# Patient Record
Sex: Female | Born: 1975 | Race: White | Hispanic: No | Marital: Married | Smoking: Current every day smoker
Health system: Southern US, Community
[De-identification: ages and names within clinical notes are randomized; demographics above are authoritative.]

## PROBLEM LIST (undated history)

## (undated) DIAGNOSIS — R55 Syncope and collapse: Secondary | ICD-10-CM

## (undated) DIAGNOSIS — F319 Bipolar disorder, unspecified: Secondary | ICD-10-CM

## (undated) DIAGNOSIS — I341 Nonrheumatic mitral (valve) prolapse: Secondary | ICD-10-CM

## (undated) DIAGNOSIS — G2581 Restless legs syndrome: Secondary | ICD-10-CM

## (undated) DIAGNOSIS — H469 Unspecified optic neuritis: Secondary | ICD-10-CM

## (undated) DIAGNOSIS — F32A Depression, unspecified: Secondary | ICD-10-CM

## (undated) DIAGNOSIS — F329 Major depressive disorder, single episode, unspecified: Secondary | ICD-10-CM

## (undated) DIAGNOSIS — R51 Headache: Secondary | ICD-10-CM

## (undated) DIAGNOSIS — F419 Anxiety disorder, unspecified: Secondary | ICD-10-CM

## (undated) HISTORY — PX: TUBAL LIGATION: SHX77

## (undated) HISTORY — PX: TONSILLECTOMY: SUR1361

## (undated) HISTORY — PX: ABDOMINAL HYSTERECTOMY: SHX81

## (undated) HISTORY — PX: TYMPANOSTOMY TUBE PLACEMENT: SHX32

---

## 1997-07-05 ENCOUNTER — Other Ambulatory Visit: Admission: RE | Admit: 1997-07-05 | Discharge: 1997-07-05 | Payer: Self-pay | Admitting: Obstetrics and Gynecology

## 1997-08-15 ENCOUNTER — Emergency Department (HOSPITAL_COMMUNITY): Admission: EM | Admit: 1997-08-15 | Discharge: 1997-08-15 | Payer: Self-pay | Admitting: Endocrinology

## 1997-08-20 ENCOUNTER — Emergency Department (HOSPITAL_COMMUNITY): Admission: EM | Admit: 1997-08-20 | Discharge: 1997-08-20 | Payer: Self-pay | Admitting: Internal Medicine

## 1997-10-20 ENCOUNTER — Ambulatory Visit (HOSPITAL_COMMUNITY): Admission: RE | Admit: 1997-10-20 | Discharge: 1997-10-20 | Payer: Self-pay | Admitting: Obstetrics and Gynecology

## 1997-10-25 ENCOUNTER — Inpatient Hospital Stay (HOSPITAL_COMMUNITY): Admission: AD | Admit: 1997-10-25 | Discharge: 1997-10-25 | Payer: Self-pay | Admitting: Obstetrics and Gynecology

## 1998-01-09 ENCOUNTER — Inpatient Hospital Stay (HOSPITAL_COMMUNITY): Admission: AD | Admit: 1998-01-09 | Discharge: 1998-01-11 | Payer: Self-pay | Admitting: Obstetrics and Gynecology

## 1998-06-28 ENCOUNTER — Emergency Department (HOSPITAL_COMMUNITY): Admission: EM | Admit: 1998-06-28 | Discharge: 1998-06-28 | Payer: Self-pay

## 1999-07-19 ENCOUNTER — Ambulatory Visit (HOSPITAL_COMMUNITY): Admission: RE | Admit: 1999-07-19 | Discharge: 1999-07-19 | Payer: Self-pay | Admitting: Obstetrics and Gynecology

## 1999-07-19 ENCOUNTER — Encounter: Payer: Self-pay | Admitting: Obstetrics and Gynecology

## 1999-12-06 ENCOUNTER — Inpatient Hospital Stay (HOSPITAL_COMMUNITY): Admission: AD | Admit: 1999-12-06 | Discharge: 1999-12-08 | Payer: Self-pay | Admitting: Obstetrics and Gynecology

## 2000-11-17 ENCOUNTER — Emergency Department (HOSPITAL_COMMUNITY): Admission: EM | Admit: 2000-11-17 | Discharge: 2000-11-18 | Payer: Self-pay | Admitting: Emergency Medicine

## 2000-11-17 ENCOUNTER — Encounter: Payer: Self-pay | Admitting: Emergency Medicine

## 2000-11-18 ENCOUNTER — Encounter: Payer: Self-pay | Admitting: Emergency Medicine

## 2000-11-22 ENCOUNTER — Encounter: Payer: Self-pay | Admitting: Emergency Medicine

## 2000-11-22 ENCOUNTER — Encounter: Admission: RE | Admit: 2000-11-22 | Discharge: 2000-11-22 | Payer: Self-pay | Admitting: Emergency Medicine

## 2000-12-22 ENCOUNTER — Encounter: Admission: RE | Admit: 2000-12-22 | Discharge: 2000-12-22 | Payer: Self-pay | Admitting: Emergency Medicine

## 2000-12-22 ENCOUNTER — Encounter: Payer: Self-pay | Admitting: Emergency Medicine

## 2001-08-25 ENCOUNTER — Ambulatory Visit (HOSPITAL_COMMUNITY): Admission: RE | Admit: 2001-08-25 | Discharge: 2001-08-25 | Payer: Self-pay | Admitting: Obstetrics and Gynecology

## 2001-08-25 ENCOUNTER — Encounter: Payer: Self-pay | Admitting: Obstetrics and Gynecology

## 2001-09-08 ENCOUNTER — Inpatient Hospital Stay (HOSPITAL_COMMUNITY): Admission: AD | Admit: 2001-09-08 | Discharge: 2001-09-08 | Payer: Self-pay | Admitting: Obstetrics and Gynecology

## 2002-01-24 ENCOUNTER — Inpatient Hospital Stay (HOSPITAL_COMMUNITY): Admission: AD | Admit: 2002-01-24 | Discharge: 2002-01-26 | Payer: Self-pay | Admitting: Obstetrics and Gynecology

## 2002-01-25 ENCOUNTER — Encounter (INDEPENDENT_AMBULATORY_CARE_PROVIDER_SITE_OTHER): Payer: Self-pay

## 2002-10-14 ENCOUNTER — Emergency Department (HOSPITAL_COMMUNITY): Admission: EM | Admit: 2002-10-14 | Discharge: 2002-10-14 | Payer: Self-pay | Admitting: Emergency Medicine

## 2003-01-27 ENCOUNTER — Inpatient Hospital Stay (HOSPITAL_COMMUNITY): Admission: EM | Admit: 2003-01-27 | Discharge: 2003-02-02 | Payer: Self-pay | Admitting: Psychiatry

## 2003-06-25 ENCOUNTER — Emergency Department (HOSPITAL_COMMUNITY): Admission: EM | Admit: 2003-06-25 | Discharge: 2003-06-25 | Payer: Self-pay | Admitting: Emergency Medicine

## 2003-06-29 ENCOUNTER — Emergency Department (HOSPITAL_COMMUNITY): Admission: EM | Admit: 2003-06-29 | Discharge: 2003-06-29 | Payer: Self-pay | Admitting: Emergency Medicine

## 2003-07-08 ENCOUNTER — Encounter: Admission: RE | Admit: 2003-07-08 | Discharge: 2003-07-08 | Payer: Self-pay | Admitting: Emergency Medicine

## 2003-07-16 ENCOUNTER — Emergency Department (HOSPITAL_COMMUNITY): Admission: EM | Admit: 2003-07-16 | Discharge: 2003-07-17 | Payer: Self-pay | Admitting: Emergency Medicine

## 2003-07-17 ENCOUNTER — Inpatient Hospital Stay (HOSPITAL_COMMUNITY): Admission: EM | Admit: 2003-07-17 | Discharge: 2003-07-19 | Payer: Self-pay | Admitting: Psychiatry

## 2003-08-12 ENCOUNTER — Ambulatory Visit (HOSPITAL_COMMUNITY): Admission: RE | Admit: 2003-08-12 | Discharge: 2003-08-12 | Payer: Self-pay | Admitting: Internal Medicine

## 2003-09-14 ENCOUNTER — Inpatient Hospital Stay (HOSPITAL_COMMUNITY): Admission: EM | Admit: 2003-09-14 | Discharge: 2003-09-16 | Payer: Self-pay | Admitting: Emergency Medicine

## 2006-05-14 ENCOUNTER — Other Ambulatory Visit: Admission: RE | Admit: 2006-05-14 | Discharge: 2006-05-14 | Payer: Self-pay | Admitting: Family Medicine

## 2007-10-09 ENCOUNTER — Other Ambulatory Visit: Admission: RE | Admit: 2007-10-09 | Discharge: 2007-10-09 | Payer: Self-pay | Admitting: Family Medicine

## 2008-04-23 ENCOUNTER — Ambulatory Visit (HOSPITAL_COMMUNITY): Admission: RE | Admit: 2008-04-23 | Discharge: 2008-04-23 | Payer: Self-pay | Admitting: Gastroenterology

## 2008-10-25 ENCOUNTER — Other Ambulatory Visit: Admission: RE | Admit: 2008-10-25 | Discharge: 2008-10-25 | Payer: Self-pay | Admitting: Family Medicine

## 2009-01-10 ENCOUNTER — Emergency Department (HOSPITAL_COMMUNITY): Admission: EM | Admit: 2009-01-10 | Discharge: 2009-01-10 | Payer: Self-pay | Admitting: Emergency Medicine

## 2009-04-13 ENCOUNTER — Emergency Department (HOSPITAL_COMMUNITY): Admission: EM | Admit: 2009-04-13 | Discharge: 2009-04-13 | Payer: Self-pay | Admitting: Emergency Medicine

## 2010-02-16 ENCOUNTER — Emergency Department (HOSPITAL_BASED_OUTPATIENT_CLINIC_OR_DEPARTMENT_OTHER)
Admission: EM | Admit: 2010-02-16 | Discharge: 2010-02-16 | Payer: Self-pay | Source: Home / Self Care | Admitting: Emergency Medicine

## 2010-05-02 LAB — POCT I-STAT, CHEM 8
BUN: 6 mg/dL (ref 6–23)
HCT: 40 % (ref 36.0–46.0)
Hemoglobin: 13.6 g/dL (ref 12.0–15.0)
TCO2: 26 mmol/L (ref 0–100)

## 2010-05-02 LAB — DIFFERENTIAL
Basophils Absolute: 0.1 10*3/uL (ref 0.0–0.1)
Basophils Relative: 1 % (ref 0–1)
Eosinophils Relative: 3 % (ref 0–5)
Lymphocytes Relative: 23 % (ref 12–46)
Lymphs Abs: 2.1 10*3/uL (ref 0.7–4.0)
Monocytes Absolute: 0.5 10*3/uL (ref 0.1–1.0)
Monocytes Relative: 6 % (ref 3–12)
Neutro Abs: 6.2 10*3/uL (ref 1.7–7.7)
Neutrophils Relative %: 68 % (ref 43–77)

## 2010-05-02 LAB — POCT CARDIAC MARKERS
Myoglobin, poc: 90.7 ng/mL (ref 12–200)
Troponin i, poc: <0.05 ng/mL (ref 0.00–0.09)
Troponin i, poc: <0.05 ng/mL (ref 0.00–0.09)

## 2010-05-02 LAB — CBC
Hemoglobin: 13.6 g/dL (ref 12.0–15.0)
Platelets: 225 10*3/uL (ref 150–400)
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
## 2010-06-16 NOTE — Discharge Summary (Signed)
NAME:  Lynn Hubbard, Lynn Hubbard                             ACCOUNT NO.:  [id number]   MEDICAL RECORD NO.:  [id number]                   PATIENT TYPE:  IPS   LOCATION:  0502                                 FACILITY:  [facility]   PHYSICIAN:  Carolanne Grumbling, M.D.                 DATE OF BIRTH:  [birth year]   DATE OF ADMISSION:  01/27/2003  DATE OF DISCHARGE:  02/02/2003                                 DISCHARGE SUMMARY   INITIAL ASSESSMENT AND DIAGNOSIS:  The patient is a 35 year old woman who  presented with depression.  She had some suicidal ideation with a plan to  overdose.  She also had some positive homicidal ideation towards the woman  she had an affair with.  She had revealed the affair to her husband.  They  had been confronted by the pastor at the church because they are all church  members of the same church.  Her husband was upset and telling her to break  it off with this woman or he would take the children.  She also had some  decreased sleep, trouble with her appetite, feelings of hopelessness and  helplessness.  She had no outpatient history for psychiatric problems in the  past.   MENTAL STATUS EXAM:  At the time of the initial evaluation revealed an  alert, oriented young woman, who came to the interview willingly and was  cooperative.  She was appropriately dressed and groomed.  Speech was clear.  She felt very guilty. She was depressed.  Affect was sad and tearful.  She  was positive for suicidal ideation and some homicidal thoughts towards the  woman she had the affair with.  There was no evidence of any thought  disorder and other psychosis.  Insight and judgment were currently adequate.   PHYSICAL EXAMINATION:  Noncontributory.   ADMISSION DIAGNOSES:   AXIS I:  Depressive disorder not otherwise specified.   AXIS II:  Deferred.   AXIS III:  Healthy.   AXIS IV:  Severe.   AXIS V:  30/70.   FINDINGS:  All indicated laboratory examinations were within normal limits  or  noncontributory.   HOSPITAL COURSE:  While in the hospital, the patient talked about all of her  issues.  She was definitely questioning her sexual identity at the moment.  She felt closer to the woman in the relationship than she does to her  husband but, at the same time, she loves her children and likes the  marriage.  She cannot really blame the husband for anything substantial and  she does not want to lose the children and really did not want to lose the  marriage at this point, though the children were the primary objective.  Consequently, she was willing to try to work it out with the husband.  The  other woman, she said, betrayed her by lying about how things really were  and, therefore, she looked worse in the church and in other people's eyes  than she thought she deserved and that is why she had the homicidal ideation  towards her.  These seemed to be more feelings and not something she would  act upon.  Her suicidal ideation disappeared after her admission and did not  return.  She had a session with her husband and they both decided to work  things out through marital counseling. At the time of discharge, she was  still feeling depressed but no longer suicidal and was more optimistic that  things could work out in the future.  She was making no threats towards the  former girlfriend or anyone else.  She wanted to go home to be with her  children.   FINAL DIAGNOSES:   AXIS I:  Depressive disorder not otherwise specified.   AXIS II:  Deferred.   AXIS III:  Healthy.   AXIS IV:  Severe.   AXIS V:  55/70.   DISCHARGE MEDICATIONS:  1. Celexa 60 mg daily.  2. Ambien 10 mg at bedtime as needed.  3. Lamictal 25 mg daily.  4. Clonazepam 0.25 mg twice daily and 1 at bedtime as needed.   ACTIVITY/DIET:  There were no restrictions.   FOLLOW UP:  She was to follow up at [facility] with an  appointment with Dr. Jennelle Human for February 16, 2003.                                                Carolanne Grumbling, M.D.    GT/MEDQ  D:  02/15/2003  T:  02/15/2003  Job:  [id number]

## 2010-06-16 NOTE — Procedures (Signed)
This is a 35 year old with syncope, rule out seizure. The patient is  experiencing 3 events per week for 3months of loss of consciousness. The  patient is described as being awake throughout the course of the study. It  is routine 17-channel EEG with 1 channel devoted to EKG utilizing the  International 10/20 lead placement system. Electrographically, the patient  appears to be in the waking, drowsy, and light natural sleep state  throughout the course of the study. While awake, the background consists of  a well-organized, well-developed, well-modulated, 10 hertz alpha activity  which is predominant to the posterior head regions and reactive to eye  opening. No interhemispheric asymmetry is identified, and no definite  epileptiform discharges are seen. There is attention of background with  decreased frequency and amplitude during drowsiness and the appearance of  _______________ complexes and sleep spindles during sleep which is fairly  brief. Photic stimulation produced some occipital photic driving at several  flash frequencies close to the patient's native frequency. Hyperventilation  did not produce significant change in the background. EKG monitor reveals  relatively regular rhythm although somewhat tachycardic at 102 beats per  minute.   CONCLUSION:  Normal awake, drowsy, and asleep EEG without seizure activity  or focal abnormality seen during the course of today's recording. Clinical  correlation is recommended.    Tyler Deis, M.D.   NWG:NFAO  D:  09/15/2003 18:05:58  T:  09/16/2003 17:12:40  Job #:  [id number]
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
## 2010-06-16 NOTE — Op Note (Signed)
   NAME:  Lynn Hubbard, Lynn Hubbard                             ACCOUNT NO.:  [id number]   MEDICAL RECORD NO.:  [id number]                   PATIENT TYPE:  INP   LOCATION:  9103                                 FACILITY:  [facility]   PHYSICIAN:  Zenaida Niece, M.D.             DATE OF BIRTH:  11-06-1975   DATE OF PROCEDURE:  01/25/2002  DATE OF DISCHARGE:                                 OPERATIVE REPORT   PREOPERATIVE AND POSTOPERATIVE DIAGNOSES:  1. Desires surgical sterility.  2. Multiparity.   PROCEDURE:  Bilateral partial salpingo-oophorectomy.   SURGEON:  Lavina Hamman, M.D.   ANESTHESIA:  Epidural.   ESTIMATED BLOOD LOSS:  Less than 50 cc.   FINDINGS:  Normal anatomy.   PROCEDURE IN DETAIL:  The patient was taken to the operating room and placed  in the dorsal supine position.  Her previously placed epidural was dosed  appropriately.  Abdomen was prepped and draped in the usual sterile fashion,  and the level of her anesthesia was found to be adequate.  Her  infraumbilical skin was infiltrated with 0.5% Marcaine w/epinephrine, and a  3-cm horizontal incision was made.  The fascia was identified and entered  sharply and thus also entered the peritoneal cavity.  Both fallopian tubes  was identified and traced to their fimbriated ends.  A knuckle tube on each  side was ligated with 0 plain gut suture.  The knuckle tube was removed  sharply on both sides.  Both ossea were identified and the stomas were  hemostatic.  The fascia was closed with 0 Vicryl and skin was closed with  running subcuticular 4-0 Vicryl and a Band-Aid.  The patient tolerated the  procedure well and was taken to the recovery room in stable condition.  Counts were correct.                                               Zenaida Niece, M.D.    TDM/MEDQ  D:  01/25/2002  T:  01/25/2002  Job:  [id number]

## 2010-06-16 NOTE — Discharge Summary (Signed)
[facility]  Patient:    Lynn Hubbard, Lynn Hubbard                          MRN: [id number] Adm. Date:  [id number] Disc. Date: 12/08/99 Attending:  Michaele Offer                           Discharge Summary  DISCHARGE DIAGNOSES:          1. Term pregnancy at 38+ weeks, delivered.                               2. Status post normal spontaneous vaginal                                  delivery.  DISCHARGE MEDICATIONS:        1. Motrin 600 mg p.o. q.6h.                               2. Percocet one to two tablets p.o. q.4h. p.r.n.  DISCHARGE FOLLOW-UP:          Patient is to follow up in the office in six weeks for her routine postpartum examination.  HOSPITAL COURSE:              Patient is a 35 year old G2, P1-0-0-1 who is admitted at 38 weeks for induction given a favorable cervix and severe pelvic pressure.  She had good fetal movement, occasional contractions on admission, and complained of increasing mood swings with a history of postpartum depression.  PRENATAL LABORATORIES:        A+.  RPR nonreactive.  Rubella immune. Hepatitis B surface antigen negative.  HIV negative.  GC negative.  Chlamydia negative.  GBS negative.  PAST OBSTETRICAL HISTORY:     In 1999 she had a normal spontaneous vaginal delivery of a 6 pound 15 ounce infant.  PAST MEDICAL HISTORY:         History of postpartum depression as stated.  PAST SURGICAL HISTORY:        She had a tonsillectomy and myringotomy with tubes.  ALLERGIES:                    LATEX.  SOCIAL HISTORY:               Patient is married and has no history of tobacco or alcohol use.  PHYSICAL EXAMINATION:  VITAL SIGNS:                  She is afebrile with stable vital signs.  Fetal heart rate was reactive.  PELVIC:                       Cervix was 3 cm, 50% effaced, and -1 station, vertex.                                She had an estimated fetal weight of approximately 7 pounds.  She had assist  of rupture of membranes with clear fluid obtained.  Patient was begun on Pitocin augmentation and progressed to 5 cm, 70% effaced, and -1 station receiving her  epidural.  She then reached complete dilation and pushed well with a normal spontaneous vaginal delivery of a vigorous female infant.  Apgars were 9 and 9.  Weight 7 pounds 3 ounces. She had a second degree perineal laceration which was repaired with 3-0 Vicryl.  Cervix and rectum were intact.  EBL was less than 500 cc.  She did well postpartum and on postpartum day #2 had no complaints.  Pain was well controlled and lochia was normal.  She was afebrile with stable vital signs. Her fundus was firm.  Therefore, she was discharged to home to follow up in six weeks with our office. DD:  12/08/99 TD:  12/08/99 Job: [id number]

## 2010-06-16 NOTE — H&P (Signed)
NAME:  ARYAM, ZHAN                             ACCOUNT NO.:  [id number]   MEDICAL RECORD NO.:  [id number]                   PATIENT TYPE:  IPS   LOCATION:  0508                                 FACILITY:  [facility]   PHYSICIAN:  Geoffery Lyons, M.D.                   DATE OF BIRTH:  04-11-1975   DATE OF ADMISSION:  07/17/2003  DATE OF DISCHARGE:                         PSYCHIATRIC ADMISSION ASSESSMENT   IDENTIFYING INFORMATION:  A 35 year old married white female, voluntarily  admitted on July 16, 2003.   HISTORY OF PRESENT ILLNESS:  The patient overdosed on 8 Klonopin at home on  July 16, 2003.  The patient reports that she had had a conflict with her  friend prior to that.  She reports that they were her own medications.  She  states some of the conflict was over an extramarital affair that she had had  with a woman at work.  The patient states that she still wants to be friends  with her.  Her friend thinks that that is not a good idea.  The patient  states that it was a stupid thing to do, overdosing.  She denies that it  was a suicide attempt.  She is not sure why she did it.  Has been sleeping  well.  Her appetite has been satisfactory.  Denies any psychotic symptoms  and states that she cannot let go of this relationship.  Has no prior  suicide attempt.   PAST PSYCHIATRIC HISTORY:  The patient was here in January 2005 for similar  situation.  She sees Dr. Jennelle Human as an outpatient and has a therapist named  Merlyn Albert May.   SOCIAL HISTORY:  She is a 35 year old married white female, married for 6  years, has 3 children, lives with her husband and children.  She works at  [employer] and her children's ages are 5, 3, and 39 months of age.   FAMILY HISTORY:  None.   ALCOHOL DRUG HISTORY:  Denies any alcohol or drug use.  Is a nonsmoker.   PAST MEDICAL HISTORY:  Primary care Yaneli Keithley is Dr. Daleen Squibb in [place].  Medical problems are arthritis in her back, episodes of having low  blood  pressures with falls.  She states it started about 1-1/2 months ago.  She  states it is called neurocardiac syncope and has tilt test scheduled on July 27, 2003.   MEDICATIONS:  Toprol XL 25 mg, Klonopin 0.5 mg b.i.d., Lamictal 150 mg  daily, has been on it for 1-1/2 months, Celexa 60 mg daily.   DRUG ALLERGIES:  No known allergies.   PHYSICAL EXAMINATION:  The patient was assessed at [facility].  She is a well-nourished young female.  Temperature is 99.4, 63  heart rate, 16 respirations, blood pressure is 110/71.  Five feet 6 inches  tall, 136 pounds.  Her urine pregnancy test was  negative.  Urinalysis was  negative.  Alcohol level was less than 5.  Urine drug screen was negative.  BMET was within normal limits.   MENTAL STATUS EXAM:  She is an alert young female, cooperative, good eye  contact, casually dressed.  Speech clear, mood:  The patient feels very  embarrassed.  The patient is pleasant and agreeable to treatment plan.  Thought processes are logical.  The patient reports obsessing over this  extramarital affair.  Cognitive function intact.  Memory is good, judgment  and insight fair.   ADMISSION DIAGNOSES:   AXIS I:  Major depressive disorder, recurrent.   AXIS II:  Deferred.   AXIS III:  Neurocardiac syncope, arthritis.   AXIS IV:  Other psychosocial problems, current medical problems.   AXIS V:  Current is 25, estimated this past year is 65-70.   PLAN:  Voluntary admission for intentional overdose.  Contract for safety,  stabilize mood and thinking.  We will resume her medications.  We will  increase her Lamictal, consider a family session.  The patient is to  increase coping skills by attending groups.   TENTATIVE LENGTH OF CARE:  3-4 days.     Landry Corporal, N.P.                       Geoffery Lyons, M.D.    JO/MEDQ  D:  07/19/2003  T:  07/19/2003  Job:  [id number]

## 2010-06-16 NOTE — Op Note (Signed)
NAME:  Lynn Hubbard, Lynn Hubbard                             ACCOUNT NO.:  [id number]   MEDICAL RECORD NO.:  [id number]                   PATIENT TYPE:  OIB   LOCATION:  2899                                 FACILITY:  [facility]   PHYSICIAN:  Doylene Canning. Ladona Ridgel, M.D.               DATE OF BIRTH:  Apr 06, 1975   DATE OF PROCEDURE:  08/12/2003  DATE OF DISCHARGE:                                 OPERATIVE REPORT   PROCEDURE PERFORMED:  Head up tilt table testing.   INDICATIONS FOR PROCEDURE:  Unexplained syncope.   I:  INTRODUCTION:  The patient is a 35 year old woman with a history of  depression who has otherwise been healthy who has had recurrent syncope over  the last month.  She is now referred for head up tilt table testing.   II:  PROCEDURE:  After informed consent was obtained, the patient was taken  to the diagnostic catheterization lab in a fasting state.  After the usual  preparation and draping, she was placed in the supine position.  Her initial  heart rate was in the mid to high 60s with a blood pressure in the 105 to  110 range.  She was placed in the 70 degree head up tilt table position and  her heart rate increased from the mid 60s to the mid 80s initially.  In  addition, the blood pressure had no significant change initially.  At  approximately 20 minutes into tilting, the patient began to feel weak and  hot and somewhat nauseated.  At 30 minutes into tilting, her blood pressure  dropped into the 80s and her heart rate increased to almost 129 beats per  minute.  At this point, she began to have blurred vision and stated that she  did not feel well.  She was maintained in this position until approximately  44 minutes into tilting when she suddenly became unresponsive.  Interestingly, her heart rate and blood pressure, when they were recorded,  did not show any abnormalities.  She was placed back in the supine position  where she immediately regained responsiveness and was subsequently  returned  to her room in satisfactory condition.   III:  COMPLICATIONS:  There were no immediate procedure complications.   IV:  RESULTS:  This head up tilt table test demonstrates a reproduction in the patient's  clinical symptoms of syncope, however, there was no clear drop in heart rate  or blood pressure at the time she became unresponsive.  In fact, her heart  rate was somewhat increased.  These findings are somewhat puzzling.  They  could certainly suggest psychogenic syncope on the tilt table.  Another  possibility would be that the patient has relative CNS hypoperfusion  resulting in her altered consciousness.  I have discussed the treatment  options with the patient and I have recommended for now a period of watchful  waiting and warning that  should she feel any of these episodes that she  should lie or sit down.  I have also  recommended that she increase her fluid and salt intake.  I have recommended  that she discontinue her beta blocker.  Additional therapies might be to  discontinue her Klonopin and/or Lamictal.  For now, would recommend holding  off on Florinef.                                               Doylene Canning. Ladona Ridgel, M.D.    GWT/MEDQ  D:  08/12/2003  T:  08/12/2003  Job:  [id number]   cc:   Jesse Sans. Wall, M.D.   Reuben Likes, M.D.  [address]  [place]  [place] [address]  Fax: [phone]

## 2010-06-16 NOTE — Discharge Summary (Signed)
NAME:  Lynn Hubbard, Lynn Hubbard                             ACCOUNT NO.:  [id number]   MEDICAL RECORD NO.:  [id number]                   PATIENT TYPE:  IPS   LOCATION:  0508                                 [facility]ILITY[facility]   PHYSICIAN:  Geoffery Lyons, M.D.                   DATE OF BIRTH:  [birth year]   DATE OF ADMISSION:  07/17/2003  DATE OF DISCHARGE:  07/19/2003                                 DISCHARGE SUMMARY   CHIEF COMPLAINT AND PRESENT ILLNESS:  This was the second admission to [facility] for this 35 year old married white female  voluntarily admitted.  She overdosed on eight Klonopin at home on June 17.  She had conflict with her friend prior to that.  They were her own  medications.  Some of the conflict was over and extramarital affair that she  had with a woman at work.  She still wanted to be friends with her.  She  stated that this was a stupid thing to do, overdosing.  She denied it was  a suicide attempt.  She was not sure why she did it.  Not sleeping well.  Satisfactory appetite.  Cannot let go of the relationship.   PAST PSYCHIATRIC HISTORY:  In January 2005, she was admitted for similar  situation.  She was seeing Meredith Staggers, M.D., and Sherron Monday.   SUBSTANCE ABUSE HISTORY:  She denied the use or abuse of any substances.   PAST MEDICAL HISTORY:  1. Arthritis in her back.  2. Low blood sugar with falls.  3. Neurocardiac syncope.   MEDICATIONS:  1. Toprol XL 25 mg per day.  2. Klonopin 0.5 mg twice a day.  3. Lamictal 150 mg daily.  4. Celexa 60 mg daily.   PHYSICAL EXAMINATION:  Physical examination was performed, failed to show  any acute findings.   LABORATORY DATA:  CBC was within normal limits.  Blood chemistries were  within normal limits.  TSH was within normal limits.   MENTAL STATUS EXAM:  Mental status exam revealed an alert, young female,  cooperative, good eye contact, casually dressed.  Speech was clear.  Mood:  Felt embarrassed,  anxious, wanting to be discharged but she was pleasant and  agreeable.  Thought processes were logical, coherent, and relevant; reported  obsessing over this extramarital affair.  Cognitive: Cognition was well  preserved.   ADMISSION DIAGNOSES:   AXIS I:  Major depression, recurrent.   AXIS II:  No diagnosis.   AXIS III:  1. Neurocardiac syncope.  2. Arthritis.   AXIS IV:  Moderate.   AXIS V:  Global assessment of functioning upon admission 30-35, highest  global assessment of functioning in the last year 65-70.   HOSPITAL COURSE:  She was admitted and started in intensive individual and  group psychotherapy.  She was given Ambien for sleep.  She was given  Lamictal 150 mg per day, Celexa 60 mg per day, Toprol XL 25 mg per daily,  Klonopin 0.5 mg three times a day, which was held.  Lamictal was increased  to 200 mg.  She kept endorsing she wanted to hurt herself, very difficult  time, and took the Klonopin.  On June 18, she was feeling much better.  She  was bright, endorsed that she was dealing with the situation better.  There  was a session with her husband and her mother.  There was much concern about  discharge but she reiterated that she was not planning to kill herself,  claimed that she understood that taking eight Klonopin was not a suicide  attempt, endorsed she was feeling much better.  On June 20, she was in full  contact with reality, no suicidal ideas, no homicidal ideas, no  hallucinations, no delusions.  They were to call the woman she was in a  relationship with and express her concerns, felt that she gave some  resolution to this.  She was denying any suicidal ideation, willing and  motivated to pursue further outpatient treatment, so we went ahead and  discharged to outpatient followup.   DISCHARGE DIAGNOSES:   AXIS I:  Major depression, recurrent.   AXIS II:  No diagnosis.   AXIS III:  1. Neurocardiac syncope.  2. Arthritis.   AXIS IV:   Moderate.   AXIS V:  Global assessment of functioning upon discharge 55-60.   DISCHARGE MEDICATIONS:  1. Toprol XL 25 mg per day.  2. Celexa 40 mg one and a half daily.   FOLLOW UP:  She was to follow up with Merlyn Albert May and Meredith Staggers, M.D.                                               Geoffery Lyons, M.D.    IL/MEDQ  D:  08/10/2003  T:  08/12/2003  Job:  [id number]

## 2010-10-26 ENCOUNTER — Emergency Department (HOSPITAL_COMMUNITY)
Admission: EM | Admit: 2010-10-26 | Discharge: 2010-10-26 | Disposition: A | Discharge status: Home / Self Care | Payer: BC Managed Care – PPO | Attending: Emergency Medicine | Admitting: Emergency Medicine

## 2010-10-26 DIAGNOSIS — X58XXXA Exposure to other specified factors, initial encounter: Secondary | ICD-10-CM | POA: Insufficient documentation

## 2010-10-26 DIAGNOSIS — R21 Rash and other nonspecific skin eruption: Secondary | ICD-10-CM | POA: Insufficient documentation

## 2010-10-26 DIAGNOSIS — T781XXA Other adverse food reactions, not elsewhere classified, initial encounter: Secondary | ICD-10-CM | POA: Insufficient documentation

## 2011-06-26 ENCOUNTER — Other Ambulatory Visit: Payer: Self-pay

## 2012-02-17 ENCOUNTER — Emergency Department (HOSPITAL_COMMUNITY): Payer: BC Managed Care – PPO

## 2012-02-17 ENCOUNTER — Encounter (HOSPITAL_COMMUNITY): Payer: Self-pay

## 2012-02-17 ENCOUNTER — Emergency Department (HOSPITAL_COMMUNITY)
Admission: EM | Admit: 2012-02-17 | Discharge: 2012-02-17 | Disposition: A | Discharge status: Home / Self Care | Payer: BC Managed Care – PPO | Attending: Emergency Medicine | Admitting: Emergency Medicine

## 2012-02-17 DIAGNOSIS — N949 Unspecified condition associated with female genital organs and menstrual cycle: Secondary | ICD-10-CM | POA: Insufficient documentation

## 2012-02-17 DIAGNOSIS — R102 Pelvic and perineal pain: Secondary | ICD-10-CM

## 2012-02-17 DIAGNOSIS — F17200 Nicotine dependence, unspecified, uncomplicated: Secondary | ICD-10-CM | POA: Insufficient documentation

## 2012-02-17 DIAGNOSIS — Z79899 Other long term (current) drug therapy: Secondary | ICD-10-CM | POA: Insufficient documentation

## 2012-02-17 DIAGNOSIS — N898 Other specified noninflammatory disorders of vagina: Secondary | ICD-10-CM | POA: Insufficient documentation

## 2012-02-17 DIAGNOSIS — Z3202 Encounter for pregnancy test, result negative: Secondary | ICD-10-CM | POA: Insufficient documentation

## 2012-02-17 DIAGNOSIS — Z8669 Personal history of other diseases of the nervous system and sense organs: Secondary | ICD-10-CM | POA: Insufficient documentation

## 2012-02-17 DIAGNOSIS — N939 Abnormal uterine and vaginal bleeding, unspecified: Secondary | ICD-10-CM

## 2012-02-17 HISTORY — DX: Syncope and collapse: R55

## 2012-02-17 HISTORY — DX: Unspecified optic neuritis: H46.9

## 2012-02-17 HISTORY — DX: Bipolar disorder, unspecified: F31.9

## 2012-02-17 LAB — URINALYSIS, ROUTINE W REFLEX MICROSCOPIC
Bilirubin Urine: NEGATIVE
Ketones, ur: NEGATIVE mg/dL
Leukocytes, UA: NEGATIVE
Urobilinogen, UA: 0.2 mg/dL (ref 0.0–1.0)
pH: 6 (ref 5.0–8.0)

## 2012-02-17 LAB — WET PREP, GENITAL
Trich, Wet Prep: NONE SEEN
Yeast Wet Prep HPF POC: NONE SEEN

## 2012-02-17 LAB — URINE MICROSCOPIC-ADD ON

## 2012-02-17 MED ORDER — HYDROMORPHONE HCL PF 1 MG/ML IJ SOLN
1.0000 mg | Freq: Once | INTRAMUSCULAR | Status: AC
  Administered 2012-02-17: 1 mg via INTRAMUSCULAR
  Filled 2012-02-17: qty 1

## 2012-02-17 MED ORDER — OXYCODONE-ACETAMINOPHEN 5-325 MG PO TABS
2.0000 | ORAL_TABLET | Freq: Once | ORAL | Status: AC
  Administered 2012-02-17: 2 via ORAL
  Filled 2012-02-17: qty 2

## 2012-02-17 MED ORDER — OXYCODONE-ACETAMINOPHEN 5-325 MG PO TABS: 1.0000 | ORAL_TABLET | Freq: Once | ORAL | Status: DC

## 2012-02-17 NOTE — ED Provider Notes (Signed)
History     CSN: [id number]  Arrival date & time 02/17/12  0907   First MD Initiated Contact with Patient 02/17/12 [phone]      Chief Complaint  Patient presents with  . Abdominal Pain    (Consider location/radiation/quality/duration/timing/severity/associated sxs/prior treatment) HPI Comments: Lynn Hubbard is a 37 y.o. Female with pelvic pain, and persistent vaginal bleeding for 2 weeks. This is similar to problems she had several months ago, when she had a pelvic infection. She had an IUD placed 4 months ago. She denies fever, chills, nausea, or vomiting. No dysuria, urinary frequency, or change in bowel habits. She's using over-the-counter medications, without relief. There are no other modifying factors.  Patient is a 37 y.o. female presenting with abdominal pain. The history is provided by the patient.  Abdominal Pain The primary symptoms of the illness include abdominal pain.    Past Medical History  Diagnosis Date  . Optic neuritis   . Syncope   . Bipolar 1 disorder     Past Surgical History  Procedure Date  . Tubal ligation   . Tonsillectomy   . Tympanostomy tube placement     History reviewed. No pertinent family history.  History  Substance Use Topics  . Smoking status: Current Every Day Smoker -- 0.5 packs/day    Types: Cigarettes  . Smokeless tobacco: Never Used  . Alcohol Use: No    OB History    Grav Para Term Preterm Abortions TAB SAB Ect Mult Living                  Review of Systems  Gastrointestinal: Positive for abdominal pain.  All other systems reviewed and are negative.    Allergies  Klonopin  Home Medications   Current Outpatient Rx  Name  Route  Sig  Dispense  Refill  . LITHIUM CARBONATE ER 450 MG PO TBCR   Oral   Take 450 mg by mouth 2 (two) times daily.         Marland Kitchen LURASIDONE HCL 40 MG PO TABS   Oral   Take 20 mg by mouth at bedtime.         . ADULT MULTIVITAMIN W/MINERALS CH   Oral   Take 1 tablet by mouth daily.           Marland Kitchen PRAMIPEXOLE DIHYDROCHLORIDE 1.5 MG PO TABS   Oral   Take 1.5 mg by mouth at bedtime.         . VENLAFAXINE HCL ER 150 MG PO CP24   Oral   Take 150 mg by mouth daily.         . OXYCODONE-ACETAMINOPHEN 5-325 MG PO TABS   Oral   Take 1 tablet by mouth once.   30 tablet   0     BP 102/65  Pulse 62  Temp 98.6 F (37 C)  Resp 20  SpO2 95%  LMP 01/28/2012  Physical Exam  Nursing note and vitals reviewed. Constitutional: She is oriented to person, place, and time. She appears well-developed and well-nourished.  HENT:  Head: Normocephalic and atraumatic.  Eyes: Conjunctivae normal and EOM are normal. Pupils are equal, round, and reactive to light.  Neck: Normal range of motion and phonation normal. Neck supple.  Cardiovascular: Normal rate, regular rhythm and intact distal pulses.   Pulmonary/Chest: Effort normal and breath sounds normal. She exhibits no tenderness.  Abdominal: Soft. She exhibits no distension. There is tenderness (Moderate superpubic tenderness). There is no guarding.  Genitourinary:  Normal external female genitalia. There is a opaque vaginal discharge. There is mild. Bleeding from the cervical os. The IUD string is visible in the cervical os. On bimanual examination the uterus is tender, without enlargement. The left adnexa is tender, without mass. I could not palpate either ovary.  Musculoskeletal: Normal range of motion.  Neurological: She is alert and oriented to person, place, and time. She has normal strength. She exhibits normal muscle tone.  Skin: Skin is warm and dry.  Psychiatric: She has a normal mood and affect. Her behavior is normal. Judgment and thought content normal.    ED Course  Procedures (including critical care time)  Emergency department treatment: Percocet, 2. She required additional number chronic analgesic by injection  Ultrasound ordered to rule out TOA.   Reevaluation at discharge. She is more  comfortable.    Labs Reviewed  WET PREP, GENITAL - Abnormal; Notable for the following:    WBC, Wet Prep HPF POC FEW (*)     All other components within normal limits  URINALYSIS, ROUTINE W REFLEX MICROSCOPIC - Abnormal; Notable for the following:    APPearance TURBID (*)     Hgb urine dipstick SMALL (*)     All other components within normal limits  URINE MICROSCOPIC-ADD ON - Abnormal; Notable for the following:    Squamous Epithelial / LPF FEW (*)     Bacteria, UA FEW (*)     All other components within normal limits  POCT PREGNANCY, URINE  GC/CHLAMYDIA PROBE AMP  URINE CULTURE   US Transvaginal Non-ob  02/17/2012  *RADIOLOGY REPORT*  Clinical Data: Pelvic pain and vaginal bleeding.  The patient has a an intrauterine device.  TRANSABDOMINAL AND TRANSVAGINAL ULTRASOUND OF PELVIS Technique:  Both transabdominal and transvaginal ultrasound examinations of the pelvis were performed. Transabdominal technique was performed for global imaging of the pelvis including uterus, ovaries, adnexal regions, and pelvic cul-de-sac.  It was necessary to proceed with endovaginal exam following the transabdominal exam to visualize the endometrium.  Comparison:  None  Findings:  Uterus: The uterus is slightly retroverted and measures 5.1 x 5.2 x 6.2 cm.  No focal uterine mass is identified.  Endometrium: Measures approximately 12 mm in maximal thickness. Evaluation is slightly limited by slight retroversion of the uterus. Technical evaluation of the endometrium is somewhat difficult due to partial retroversion of the uterus.  On sagittal transvaginal images there is echogenicity with shadowing and the endometrial canal of the lower uterine segment.  Echogenic lines consistent with an intrauterine deviceare seen also within the endocervical canal.  Findings suggest low positioning of the intrauterine device. On the transvaginal images, an intrauterine device is not seen within the fundal portion of the endometrial  cavity.  Right ovary:  Normal appearance/no adnexal mass  Left ovary: Normal appearance/no adnexal mass  Other findings: A small amount of free fluid.  IMPRESSION: 1.  The intrauterine device appears suboptimally positioned.  It is visualized within the lower uterine segment and endocervical canal. Question if this could be the cause of patient's pain. Suggest follow up with gynecology. 2.  Uterus is partially retroverted, which makes evaluation of the endometrial canal or technically challenging. 3.  Normal sonographic appearance of the ovaries.   Original Report Authenticated By: Britta Mccreedy, M.D.    US Pelvis Complete  02/17/2012  *RADIOLOGY REPORT*  Clinical Data: Pelvic pain and vaginal bleeding.  The patient has a an intrauterine device.  TRANSABDOMINAL AND TRANSVAGINAL ULTRASOUND OF PELVIS Technique:  Both transabdominal and  transvaginal ultrasound examinations of the pelvis were performed. Transabdominal technique was performed for global imaging of the pelvis including uterus, ovaries, adnexal regions, and pelvic cul-de-sac.  It was necessary to proceed with endovaginal exam following the transabdominal exam to visualize the endometrium.  Comparison:  None  Findings:  Uterus: The uterus is slightly retroverted and measures 5.1 x 5.2 x 6.2 cm.  No focal uterine mass is identified.  Endometrium: Measures approximately 12 mm in maximal thickness. Evaluation is slightly limited by slight retroversion of the uterus. Technical evaluation of the endometrium is somewhat difficult due to partial retroversion of the uterus.  On sagittal transvaginal images there is echogenicity with shadowing and the endometrial canal of the lower uterine segment.  Echogenic lines consistent with an intrauterine deviceare seen also within the endocervical canal.  Findings suggest low positioning of the intrauterine device. On the transvaginal images, an intrauterine device is not seen within the fundal portion of the endometrial  cavity.  Right ovary:  Normal appearance/no adnexal mass  Left ovary: Normal appearance/no adnexal mass  Other findings: A small amount of free fluid.  IMPRESSION: 1.  The intrauterine device appears suboptimally positioned.  It is visualized within the lower uterine segment and endocervical canal. Question if this could be the cause of patient's pain. Suggest follow up with gynecology. 2.  Uterus is partially retroverted, which makes evaluation of the endometrial canal or technically challenging. 3.  Normal sonographic appearance of the ovaries.   Original Report Authenticated By: Britta Mccreedy, M.D.    Nursing notes, applicable records and vitals reviewed.  Radiologic Images/Reports reviewed.   1. Pelvic pain   2. Vaginal bleeding       MDM  Nonspecific telemetry pain, with reassuring. Evaluation in ED. Doubt PID, TOA, uncontrolled, vaginal bleeding. Doubt metabolic instability, serious bacterial infection or impending vascular collapse; the patient is stable for discharge.    Plan: Home Medications- Percocet; Home Treatments- rest; Recommended follow up- GYN, when necessary     Flint Melter, MD 02/17/12 1551

## 2012-02-17 NOTE — ED Notes (Addendum)
Patient c/o lower abdominal cramping and that radiates into bilateral lower back. Patient reports sporadic vaginal bleeding since having an IUD placed in August 2013. Patient denies dysuria or vaginal discharge.

## 2012-02-18 LAB — URINE CULTURE: Colony Count: 5000

## 2012-02-18 LAB — GC/CHLAMYDIA PROBE AMP: GC Probe RNA: NEGATIVE

## 2012-02-23 ENCOUNTER — Encounter (HOSPITAL_COMMUNITY): Payer: Self-pay | Admitting: Obstetrics and Gynecology

## 2012-02-23 ENCOUNTER — Inpatient Hospital Stay (HOSPITAL_COMMUNITY)
Admission: AD | Admit: 2012-02-23 | Discharge: 2012-02-23 | Disposition: A | Discharge status: Home / Self Care | Payer: BC Managed Care – PPO | Source: Ambulatory Visit | Attending: Obstetrics & Gynecology | Admitting: Obstetrics & Gynecology

## 2012-02-23 DIAGNOSIS — R109 Unspecified abdominal pain: Secondary | ICD-10-CM | POA: Insufficient documentation

## 2012-02-23 DIAGNOSIS — N938 Other specified abnormal uterine and vaginal bleeding: Secondary | ICD-10-CM | POA: Insufficient documentation

## 2012-02-23 DIAGNOSIS — N949 Unspecified condition associated with female genital organs and menstrual cycle: Secondary | ICD-10-CM | POA: Insufficient documentation

## 2012-02-23 HISTORY — DX: Restless legs syndrome: G25.81

## 2012-02-23 LAB — URINALYSIS, ROUTINE W REFLEX MICROSCOPIC
Protein, ur: NEGATIVE mg/dL
Urobilinogen, UA: 0.2 mg/dL (ref 0.0–1.0)

## 2012-02-23 LAB — CBC
Hemoglobin: 13.8 g/dL (ref 12.0–15.0)
MCH: 30.2 pg (ref 26.0–34.0)
MCV: 90.2 fL (ref 78.0–100.0)
Platelets: 238 10*3/uL (ref 150–400)
RBC: 4.57 MIL/uL (ref 3.87–5.11)

## 2012-02-23 LAB — URINE MICROSCOPIC-ADD ON

## 2012-02-23 MED ORDER — KETOROLAC TROMETHAMINE 60 MG/2ML IM SOLN
60.0000 mg | Freq: Once | INTRAMUSCULAR | Status: AC
  Administered 2012-02-23: 60 mg via INTRAMUSCULAR
  Filled 2012-02-23: qty 2

## 2012-02-23 NOTE — MAU Provider Note (Signed)
History     CSN: [id number]  Arrival date and time: 02/23/12 7846   First Provider Initiated Contact with Patient 02/23/12 [phone]      Chief Complaint  Patient presents with  . Vaginal Bleeding   HPI  Pt is not pregnant and had onset of pain on [place] and seen at [facility] ED 1/19and had [place] that stated that IUD had moved, but would not remove the IUD. ([place] report below) The IUD was removed on Monday in the office by Dr. Dareen Piano without difficulty.  Pt' pain was much improved until Wednesday when spotting started and then cramping started Wednesday night.  The pain has increased and pt's bleeding had increased with passing large baseball/peach size clots.  She has felt dizzy and nauseated like she was going to pass out.  Pt took Ibuprofen at 4:30am without any relief.  Pt denies fever, chills, constipation or diarrhea or UTI symptoms.  She normally has cramps with her period but not to this extent. Pt had wet prep and GC/Chlamdyia at her ED visit which ws negative  Past Medical History  Diagnosis Date  . Optic neuritis   . Syncope   . Bipolar 1 disorder   . Bipolar 1 disorder   . Restless leg syndrome     Past Surgical History  Procedure Date  . Tubal ligation   . Tonsillectomy   . Tympanostomy tube placement     History reviewed. No pertinent family history.  History  Substance Use Topics  . Smoking status: Current Every Day Smoker -- 0.5 packs/day    Types: Cigarettes  . Smokeless tobacco: Never Used  . Alcohol Use: No    Allergies:  Allergies  Allergen Reactions  . Klonopin (Clonazepam)     Low blood pressure, syncope.    Prescriptions prior to admission  Medication Sig Dispense Refill  . lithium carbonate (ESKALITH) 450 MG CR tablet Take 450 mg by mouth 2 (two) times daily.      Marland Kitchen lurasidone (LATUDA) 40 MG TABS Take 20 mg by mouth at bedtime.      . Multiple Vitamin (MULTIVITAMIN WITH MINERALS) TABS Take 1 tablet by mouth daily.      . pramipexole (MIRAPEX) 1.5 MG  tablet Take 1.5 mg by mouth at bedtime.      Marland Kitchen venlafaxine XR (EFFEXOR-XR) 150 MG 24 hr capsule Take 150 mg by mouth daily.        Review of Systems  Constitutional: Negative for fever and chills.  Gastrointestinal: Positive for nausea and abdominal pain. Negative for diarrhea and constipation.  Musculoskeletal: Positive for back pain.       Especially right side radiating down to knee.  Neurological: Positive for dizziness. Negative for headaches.   Physical Exam   Blood pressure 136/88, pulse 91, temperature 98.6 F (37 C), resp. rate 18, height 5\' 6"  (1.676 m), weight 220 lb (99.791 kg), last menstrual period 02/23/2012.  Physical Exam  Vitals reviewed. Constitutional: She is oriented to person, place, and time. She appears well-developed and well-nourished.  HENT:  Head: Normocephalic.  Eyes: Pupils are equal, round, and reactive to light.  Neck: Normal range of motion. Neck supple.  Cardiovascular: Normal rate.   Respiratory: Effort normal.  GI: Soft. She exhibits no distension. There is tenderness. There is guarding. There is no rebound.  Genitourinary:       Speculum exam was tender- small amount of pink tinged discharge in vault; bimanual diffusely tender difficult to assess due to pain and habitus  Musculoskeletal: Normal range of motion.  Neurological: She is alert and oriented to person, place, and time.  Skin: Skin is warm and dry.  Psychiatric: She has a normal mood and affect.    MAU Course  Procedures Ultrasound from 02/17/2012 Clinical Data: Pelvic pain and vaginal bleeding. The patient has a  an intrauterine device.  TRANSABDOMINAL AND TRANSVAGINAL ULTRASOUND OF PELVIS  Technique: Both transabdominal and transvaginal ultrasound  examinations of the pelvis were performed. Transabdominal technique  was performed for global imaging of the pelvis including uterus,  ovaries, adnexal regions, and pelvic cul-de-sac.  It was necessary to proceed with endovaginal  exam following the  transabdominal exam to visualize the endometrium.  Comparison: None  Findings:  Uterus: The uterus is slightly retroverted and measures 5.1 x 5.2 x  6.2 cm. No focal uterine mass is identified.  Endometrium: Measures approximately 12 mm in maximal thickness.  Evaluation is slightly limited by slight retroversion of the  uterus. Technical evaluation of the endometrium is somewhat  difficult due to partial retroversion of the uterus. On sagittal  transvaginal images there is echogenicity with shadowing and the  endometrial canal of the lower uterine segment. Echogenic lines  consistent with an intrauterine deviceare seen also within the  endocervical canal. Findings suggest low positioning of the  intrauterine device. On the transvaginal images, an intrauterine  device is not seen within the fundal portion of the endometrial  cavity.  Right ovary: Normal appearance/no adnexal mass  Left ovary: Normal appearance/no adnexal mass  Other findings: A small amount of free fluid.  IMPRESSION:  1. The intrauterine device appears suboptimally positioned. It is  visualized within the lower uterine segment and endocervical canal.  Question if this could be the cause of patient's pain. Suggest  follow up with gynecology.  2. Uterus is partially retroverted, which makes evaluation of the  endometrial canal or technically challenging.  3. Normal sonographic appearance of the ovaries.  Original  Dr. Arlyce Dice aware of pt's arrival and status Toradol 60mg  IM given and CBC drawn Dr. Arlyce Dice here to see pt Results for orders placed during the hospital encounter of 02/23/12 (from the past 24 hour(s))  URINALYSIS, ROUTINE W REFLEX MICROSCOPIC     Status: Abnormal   Collection Time   02/23/12  9:06 AM      Component Value Range   Color, Urine YELLOW  YELLOW   APPearance CLEAR  CLEAR   Specific Gravity, Urine 1.015  1.005 - 1.030   pH 6.5  5.0 - 8.0   Glucose, UA NEGATIVE  NEGATIVE  mg/dL   Hgb urine dipstick LARGE (*) NEGATIVE   Bilirubin Urine NEGATIVE  NEGATIVE   Ketones, ur NEGATIVE  NEGATIVE mg/dL   Protein, ur NEGATIVE  NEGATIVE mg/dL   Urobilinogen, UA 0.2  0.0 - 1.0 mg/dL   Nitrite NEGATIVE  NEGATIVE   Leukocytes, UA TRACE (*) NEGATIVE  URINE MICROSCOPIC-ADD ON     Status: Abnormal   Collection Time   02/23/12  9:06 AM      Component Value Range   Squamous Epithelial / LPF FEW (*) RARE   WBC, UA 0-2  <3 WBC/hpf   RBC / HPF 21-50  <3 RBC/hpf  CBC     Status: Abnormal   Collection Time   02/23/12 10:01 AM      Component Value Range   WBC 12.4 (*) 4.0 - 10.5 K/uL   RBC 4.57  3.87 - 5.11 MIL/uL   Hemoglobin 13.8  12.0 -  15.0 g/dL   HCT 60.4  54.0 - 98.1 %   MCV 90.2  78.0 - 100.0 fL   MCH 30.2  26.0 - 34.0 pg   MCHC 33.5  30.0 - 36.0 g/dL   RDW 19.1  47.8 - 29.5 %   Platelets 238  150 - 400 K/uL   Assessment and Plan    LINEBERRY,SUSAN 02/23/2012, 9:47 AM

## 2012-02-23 NOTE — MAU Note (Signed)
Pt presents to MAU with chief complaint of bleeding/cramping. Pt has a history of heavy menstrual cycles, had a tubal ligation 10 years ago and had the mirena IUD placed to help with heavy periods. The IUD became dislodged and she had it removed Monday. Pt is now having heaving bleeding and cramping and agrees this is most likely her menstrual cycle. She see's Dr. Arlyce Dice and is requesting he do an ablation.

## 2012-02-23 NOTE — MAU Note (Signed)
Was evaluated at [facility] Sunday January 19th. night for abdominal pain. Pt states she had mirena taken out on Monday because she was having abdominal pain. States she starting spotting bright red blood 2 days after having it removed. Last night the bleeding got worse and she says she started having clots.

## 2012-02-23 NOTE — MAU Provider Note (Signed)
S: presents with menorrhagia and dysmenorrhea.  Patient with long (>1 yr.) history of menstrual problems.  Mirena IUD placed about 6 months ago with good result.  Developed spotting and pain and IUD was removed earlier this week.  Developed heavy bleeding, cramps, and general malaise 24 hours ago.  O:  Afebrile.  Pulse 92.  BP 150/90  Tender bimanual exam but no masses noted.  Hemoglobin 13 gms.  WBC 12.4  Ultrasound from 1/21 showed no adnexal or uterine pathology.  A:  Menorrhagia but no anemia.  Dysmenorrhea  P:  Toradol IM now.  Percocet 5 to take home.  Make appointment to see me next week to discuss management.

## 2012-03-05 ENCOUNTER — Encounter (HOSPITAL_COMMUNITY): Payer: Self-pay | Admitting: Pharmacist

## 2012-03-17 ENCOUNTER — Encounter (HOSPITAL_COMMUNITY): Payer: Self-pay

## 2012-03-17 ENCOUNTER — Encounter (HOSPITAL_COMMUNITY)
Admission: RE | Admit: 2012-03-17 | Discharge: 2012-03-17 | Disposition: A | Discharge status: Home / Self Care | Payer: BC Managed Care – PPO | Source: Ambulatory Visit | Attending: Obstetrics & Gynecology | Admitting: Obstetrics & Gynecology

## 2012-03-17 HISTORY — DX: Headache: R51

## 2012-03-17 HISTORY — DX: Nonrheumatic mitral (valve) prolapse: I34.1

## 2012-03-17 HISTORY — DX: Major depressive disorder, single episode, unspecified: F32.9

## 2012-03-17 HISTORY — DX: Depression, unspecified: F32.A

## 2012-03-17 HISTORY — DX: Anxiety disorder, unspecified: F41.9

## 2012-03-17 LAB — CBC
HCT: 41.2 % (ref 36.0–46.0)
MCH: 30.1 pg (ref 26.0–34.0)
MCHC: 33.3 g/dL (ref 30.0–36.0)
MCV: 90.5 fL (ref 78.0–100.0)
RDW: 13.5 % (ref 11.5–15.5)

## 2012-03-17 NOTE — Patient Instructions (Signed)
Your procedure is scheduled on:03/19/12  Enter through the Main Entrance at :10am  Pick up desk phone and dial [phone] and inform us of your arrival.  Please call [phone] if you have any problems the morning of surgery.  Remember: Do not eat after midnight:TUESDAY Clear liquids until 0730 am WED.  Take these meds the morning of surgery with a sip of water:normal am meds  DO NOT wear jewelry, eye make-up, lipstick,body lotion, or dark fingernail polish. Do not shave for 48 hours prior to surgery.  If you are to be admitted after surgery, leave suitcase in car until your room has been assigned. Patients discharged on the day of surgery will not be allowed to drive home.

## 2012-03-18 NOTE — H&P (Signed)
Lynn Hubbard is a 37 y.o. female who is admitted for vaginal hysterectomy and bilateral fimbriectomies.  She presented to my office May 2013 with complaints of heavy and prolonged menses.  I placed a Mirena IUD with good result for several months.  She once again developed dysmenorrhea and her IUD was removed in January.  Her menorrhagia and dysmenorrhea have worsened since then.  OB History: G3, P3.  S/P BTL   Menstrual History: Patient's last menstrual period was 02/23/2012.    Past Medical History  Diagnosis Date  . Optic neuritis   . Syncope   . Restless leg syndrome   . Headache     migraines  . Mitral valve prolapse   . Anxiety   . Bipolar 1 disorder   . Bipolar 1 disorder   . Depression     Past Surgical History  Procedure Laterality Date  . Tubal ligation    . Tonsillectomy    . Tympanostomy tube placement      No family history on file.  Social History:  reports that she has been smoking Cigarettes.  She has been smoking about 0.50 packs per day. She has never used smokeless tobacco. She reports that she does not drink alcohol or use illicit drugs.  Allergies:  Allergies  Allergen Reactions  . Klonopin (Clonazepam)     Low blood pressure, syncope.    No prescriptions prior to admission    ROS: non contributory  Last menstrual period 02/23/2012. Physical Exam  Constitutional: She appears well-developed and well-nourished.  Neck: No thyromegaly present.  Cardiovascular: Normal rate and regular rhythm.   Respiratory: Effort normal.  GI: Soft.  Genitourinary: Vagina normal and uterus normal.   Assessment/Plan: Persistent dysmenorrhea and menorrhagia which have not responded to hormonal therapies.  Patient believes that the  risks of vaginal hysterectomy are outweighed by the benefits.  I agree.  Will proceed with vaginal hysterectomy and attempt bilateral fimbriectomies for ovarian cancer prophylaxis.  Lynn Hubbard D 03/18/2012, 5:35 PM

## 2012-03-19 ENCOUNTER — Encounter (HOSPITAL_COMMUNITY)
Admission: RE | Disposition: A | Discharge status: Home / Self Care | Payer: Self-pay | Source: Ambulatory Visit | Attending: Obstetrics & Gynecology

## 2012-03-19 ENCOUNTER — Ambulatory Visit (HOSPITAL_COMMUNITY): Payer: BC Managed Care – PPO | Admitting: Anesthesiology

## 2012-03-19 ENCOUNTER — Observation Stay (HOSPITAL_COMMUNITY)
Admission: RE | Admit: 2012-03-19 | Discharge: 2012-03-20 | Disposition: A | Discharge status: Home / Self Care | Payer: BC Managed Care – PPO | Source: Ambulatory Visit | Attending: Obstetrics & Gynecology | Admitting: Obstetrics & Gynecology

## 2012-03-19 ENCOUNTER — Encounter (HOSPITAL_COMMUNITY): Payer: Self-pay | Admitting: Anesthesiology

## 2012-03-19 ENCOUNTER — Encounter (HOSPITAL_COMMUNITY): Payer: Self-pay | Admitting: *Deleted

## 2012-03-19 DIAGNOSIS — N946 Dysmenorrhea, unspecified: Secondary | ICD-10-CM | POA: Insufficient documentation

## 2012-03-19 DIAGNOSIS — D251 Intramural leiomyoma of uterus: Secondary | ICD-10-CM | POA: Insufficient documentation

## 2012-03-19 DIAGNOSIS — N920 Excessive and frequent menstruation with regular cycle: Principal | ICD-10-CM | POA: Insufficient documentation

## 2012-03-19 DIAGNOSIS — I059 Rheumatic mitral valve disease, unspecified: Secondary | ICD-10-CM | POA: Insufficient documentation

## 2012-03-19 HISTORY — PX: VAGINAL HYSTERECTOMY: SHX2639

## 2012-03-19 LAB — CBC
Hemoglobin: 11.7 g/dL — ABNORMAL LOW (ref 12.0–15.0)
MCHC: 34 g/dL (ref 30.0–36.0)
RBC: 3.86 MIL/uL — ABNORMAL LOW (ref 3.87–5.11)
WBC: 17.4 10*3/uL — ABNORMAL HIGH (ref 4.0–10.5)

## 2012-03-19 SURGERY — HYSTERECTOMY, VAGINAL
Anesthesia: General | Site: Vagina | Wound class: Clean Contaminated

## 2012-03-19 MED ORDER — FENTANYL CITRATE 0.05 MG/ML IJ SOLN
25.0000 ug | INTRAMUSCULAR | Status: DC | PRN
  Administered 2012-03-19 (×2): 50 ug via INTRAVENOUS

## 2012-03-19 MED ORDER — ONDANSETRON HCL 4 MG/2ML IJ SOLN
INTRAMUSCULAR | Status: AC
  Filled 2012-03-19: qty 2

## 2012-03-19 MED ORDER — KETOROLAC TROMETHAMINE 30 MG/ML IJ SOLN: 30.0000 mg | Freq: Four times a day (QID) | INTRAMUSCULAR | Status: DC

## 2012-03-19 MED ORDER — FENTANYL CITRATE 0.05 MG/ML IJ SOLN
INTRAMUSCULAR | Status: DC | PRN
  Administered 2012-03-19: 50 ug via INTRAVENOUS
  Administered 2012-03-19: 25 ug via INTRAVENOUS
  Administered 2012-03-19: 50 ug via INTRAVENOUS
  Administered 2012-03-19: 100 ug via INTRAVENOUS
  Administered 2012-03-19: 25 ug via INTRAVENOUS

## 2012-03-19 MED ORDER — LURASIDONE HCL 40 MG PO TABS
20.0000 mg | ORAL_TABLET | Freq: Every day | ORAL | Status: DC
  Administered 2012-03-20: 20 mg via ORAL
  Filled 2012-03-19 (×2): qty 1

## 2012-03-19 MED ORDER — MIDAZOLAM HCL 2 MG/2ML IJ SOLN
INTRAMUSCULAR | Status: AC
  Filled 2012-03-19: qty 2

## 2012-03-19 MED ORDER — LACTATED RINGERS IV SOLN
INTRAVENOUS | Status: DC
  Administered 2012-03-19 (×2): via INTRAVENOUS

## 2012-03-19 MED ORDER — CEFAZOLIN SODIUM-DEXTROSE 2-3 GM-% IV SOLR
2.0000 g | INTRAVENOUS | Status: AC
  Administered 2012-03-19: 2 g via INTRAVENOUS

## 2012-03-19 MED ORDER — ONDANSETRON HCL 4 MG/2ML IJ SOLN
INTRAMUSCULAR | Status: DC | PRN
  Administered 2012-03-19: 4 mg via INTRAVENOUS

## 2012-03-19 MED ORDER — IBUPROFEN 600 MG PO TABS
600.0000 mg | ORAL_TABLET | Freq: Four times a day (QID) | ORAL | Status: DC | PRN
  Administered 2012-03-20 (×2): 600 mg via ORAL
  Filled 2012-03-19 (×2): qty 1

## 2012-03-19 MED ORDER — PRAMIPEXOLE DIHYDROCHLORIDE 1.5 MG PO TABS
1.5000 mg | ORAL_TABLET | Freq: Every day | ORAL | Status: DC
  Administered 2012-03-19: 1.5 mg via ORAL
  Filled 2012-03-19 (×2): qty 1

## 2012-03-19 MED ORDER — KETOROLAC TROMETHAMINE 30 MG/ML IJ SOLN: 15.0000 mg | Freq: Once | INTRAMUSCULAR | Status: DC | PRN

## 2012-03-19 MED ORDER — LIDOCAINE HCL (CARDIAC) 20 MG/ML IV SOLN
INTRAVENOUS | Status: AC
  Filled 2012-03-19: qty 5

## 2012-03-19 MED ORDER — KETOROLAC TROMETHAMINE 30 MG/ML IJ SOLN
INTRAMUSCULAR | Status: DC | PRN
  Administered 2012-03-19: 30 mg via INTRAVENOUS

## 2012-03-19 MED ORDER — CEFAZOLIN SODIUM-DEXTROSE 2-3 GM-% IV SOLR
INTRAVENOUS | Status: AC
  Filled 2012-03-19: qty 50

## 2012-03-19 MED ORDER — FENTANYL CITRATE 0.05 MG/ML IJ SOLN
INTRAMUSCULAR | Status: AC
  Administered 2012-03-19: 50 ug via INTRAVENOUS
  Filled 2012-03-19: qty 2

## 2012-03-19 MED ORDER — LITHIUM CARBONATE ER 450 MG PO TBCR
450.0000 mg | EXTENDED_RELEASE_TABLET | Freq: Two times a day (BID) | ORAL | Status: DC
  Administered 2012-03-19 – 2012-03-20 (×2): 450 mg via ORAL
  Filled 2012-03-19 (×4): qty 1

## 2012-03-19 MED ORDER — BUPIVACAINE-EPINEPHRINE 0.5% -1:200000 IJ SOLN
INTRAMUSCULAR | Status: DC | PRN
  Administered 2012-03-19: 8 mL

## 2012-03-19 MED ORDER — LIDOCAINE HCL (CARDIAC) 20 MG/ML IV SOLN
INTRAVENOUS | Status: DC | PRN
  Administered 2012-03-19: 30 mg via INTRAVENOUS

## 2012-03-19 MED ORDER — HYDROCODONE-ACETAMINOPHEN 5-325 MG PO TABS
1.0000 | ORAL_TABLET | ORAL | Status: DC | PRN
  Administered 2012-03-19 – 2012-03-20 (×3): 2 via ORAL
  Filled 2012-03-19 (×3): qty 2

## 2012-03-19 MED ORDER — OXYCODONE-ACETAMINOPHEN 5-325 MG PO TABS: 1.0000 | ORAL_TABLET | ORAL | Status: DC | PRN

## 2012-03-19 MED ORDER — PROPOFOL 10 MG/ML IV EMUL
INTRAVENOUS | Status: AC
  Filled 2012-03-19: qty 20

## 2012-03-19 MED ORDER — KETOROLAC TROMETHAMINE 30 MG/ML IJ SOLN
30.0000 mg | Freq: Four times a day (QID) | INTRAMUSCULAR | Status: DC
  Administered 2012-03-19 – 2012-03-20 (×2): 30 mg via INTRAVENOUS
  Filled 2012-03-19: qty 1

## 2012-03-19 MED ORDER — BUPIVACAINE-EPINEPHRINE (PF) 0.5% -1:200000 IJ SOLN
INTRAMUSCULAR | Status: AC
  Filled 2012-03-19: qty 10

## 2012-03-19 MED ORDER — PROPOFOL 10 MG/ML IV EMUL
INTRAVENOUS | Status: DC | PRN
  Administered 2012-03-19: 200 mg via INTRAVENOUS

## 2012-03-19 MED ORDER — FENTANYL CITRATE 0.05 MG/ML IJ SOLN
INTRAMUSCULAR | Status: AC
  Filled 2012-03-19: qty 5

## 2012-03-19 MED ORDER — VENLAFAXINE HCL ER 150 MG PO CP24
150.0000 mg | ORAL_CAPSULE | Freq: Every day | ORAL | Status: DC
  Administered 2012-03-20: 150 mg via ORAL
  Filled 2012-03-19 (×2): qty 1

## 2012-03-19 MED ORDER — MIDAZOLAM HCL 2 MG/2ML IJ SOLN: 1.0000 mg | Freq: Once | INTRAMUSCULAR | Status: DC

## 2012-03-19 MED ORDER — HYDROMORPHONE HCL PF 1 MG/ML IJ SOLN
0.2000 mg | INTRAMUSCULAR | Status: DC | PRN
  Administered 2012-03-19 (×3): 0.6 mg via INTRAVENOUS
  Filled 2012-03-19 (×3): qty 1

## 2012-03-19 MED ORDER — MEPERIDINE HCL 25 MG/ML IJ SOLN: 6.2500 mg | INTRAMUSCULAR | Status: DC | PRN

## 2012-03-19 MED ORDER — MIDAZOLAM HCL 2 MG/2ML IJ SOLN: 0.5000 mg | Freq: Once | INTRAMUSCULAR | Status: DC | PRN

## 2012-03-19 MED ORDER — ADULT MULTIVITAMIN W/MINERALS CH
1.0000 | ORAL_TABLET | Freq: Every day | ORAL | Status: DC
  Administered 2012-03-20: 1 via ORAL
  Filled 2012-03-19 (×2): qty 1

## 2012-03-19 MED ORDER — PROMETHAZINE HCL 25 MG/ML IJ SOLN: 6.2500 mg | INTRAMUSCULAR | Status: DC | PRN

## 2012-03-19 SURGICAL SUPPLY — 29 items
CANISTER SUCTION 2500CC (MISCELLANEOUS) ×2 IMPLANT
CLOTH BEACON ORANGE TIMEOUT ST (SAFETY) ×2 IMPLANT
CONT PATH 16OZ SNAP LID 3702 (MISCELLANEOUS) IMPLANT
DECANTER SPIKE VIAL GLASS SM (MISCELLANEOUS) ×1 IMPLANT
DRESSING TELFA 8X3 (GAUZE/BANDAGES/DRESSINGS) ×2 IMPLANT
ELECT LIGASURE SHORT 9 REUSE (ELECTRODE) ×2 IMPLANT
GLOVE ECLIPSE 6.0 STRL STRAW (GLOVE) ×2 IMPLANT
GLOVE ECLIPSE 6.5 STRL STRAW (GLOVE) ×4 IMPLANT
GOWN STRL REIN XL XLG (GOWN DISPOSABLE) ×10 IMPLANT
NEEDLE HYPO 22GX1.5 SAFETY (NEEDLE) IMPLANT
NEEDLE SPNL 20GX3.5 QUINCKE YW (NEEDLE) ×2 IMPLANT
NEEDLE SPNL 22GX3.5 QUINCKE BK (NEEDLE) IMPLANT
NS IRRIG 1000ML POUR BTL (IV SOLUTION) ×2 IMPLANT
PACK VAGINAL WOMENS (CUSTOM PROCEDURE TRAY) ×2 IMPLANT
PAD OB MATERNITY 4.3X12.25 (PERSONAL CARE ITEMS) ×2 IMPLANT
SUT CHROMIC 0 CT 1 (SUTURE) IMPLANT
SUT CHROMIC 3 0 SH 27 (SUTURE) IMPLANT
SUT MNCRL 0 MO-4 VIOLET 18 CR (SUTURE) ×1 IMPLANT
SUT MNCRL 0 VIOLET 6X18 (SUTURE) ×1 IMPLANT
SUT MONOCRYL 0 6X18 (SUTURE) ×1
SUT MONOCRYL 0 MO 4 18  CR/8 (SUTURE) ×1
SUT VIC AB 0 CT1 27 (SUTURE) ×4
SUT VIC AB 0 CT1 27XBRD ANBCTR (SUTURE) ×2 IMPLANT
SUT VIC AB 0 CT1 36 (SUTURE) ×2 IMPLANT
SUT VIC AB 3-0 SH 27 (SUTURE)
SUT VIC AB 3-0 SH 27X BRD (SUTURE) IMPLANT
TOWEL OR 17X24 6PK STRL BLUE (TOWEL DISPOSABLE) ×4 IMPLANT
TRAY FOLEY CATH 14FR (SET/KITS/TRAYS/PACK) ×2 IMPLANT
WATER STERILE IRR 1000ML POUR (IV SOLUTION) ×2 IMPLANT

## 2012-03-19 NOTE — Progress Notes (Signed)
I have interviewed and performed the pertinent exams on my patient to confirm that there have been no significant changes in her condition since the dictation of her history and physical exam.  

## 2012-03-19 NOTE — Op Note (Signed)
Patient Name: Lynn Hubbard MRN: [id number]  Date of Surgery: 03/19/2012    PREOPERATIVE DIAGNOSIS: [place], [place] [address]  [id number]  POSTOPERATIVE DIAGNOSIS: MENORRAHGIA, DYSMENORRHEA   PROCEDURE: Vaginal Hysterectomy  SURGEON: Caralyn Guile. Arlyce Dice M.D.  ASSISTANT: Luvenia Redden  ANESTHESIA: General endotracheal  ESTIMATED BLOOD LOSS: 200 ml  FINDINGS: Top normal sized uterus with excellent support.  Adnexa could not be visualized.   COMPLICATIONS: None  INDICATIONS: Persistent painful and heavy periods not relieved by hormonal therapy   PROCEDURE IN DETAIL:  The lower abdomen, perineum, and vagina were prepped and draped in a sterile fashion.  A weighted speculum was placed and the cervix was grasped with a Jacob's tenaculum.  The para cervical tissue was infiltrated with 0.5% Marcaine with 1:200,000 epinephrine.  The cervix was circumcised.  The vaginal mucosa was advanced until the posterior cul de sac was identified and entered sharply.  The uterosacral ligaments were clamped, cut, tied, and held bilaterally.  The remainder of the Cardinal ligaments were clamped with a Ligasure clamp, cauterized, and cut.  The anterior cul de sac was identified and entered.  The uterine arteries and lower Broad ligament were clamped, cauterized, and cut.  The uterine-ovarian anastomosis and remaining Broad ligament structures were clamped, cauterized, and transected.  Neither adnexa could be visualized and the decision was made not to proceed with bilateral fimbriectomies.  The specimen was removed and sent to pathology.  Bleeding from the uteroovarian anastomisis was controled with cautery and a figure of 8 suture in the left.  The posterior vaginal cuff was whipped with an interlocking 0 Vicryl suture.  The cul de sac was closed and the vagina suspended with a McCall's suture involving the uterosacral ligaments.  The peritoneum was closed with a purse string 0 Vicryl suture.  The anterior vaginal  cuff was closed with figure of eight 3-0 Vicryl suture.  Sponge and instrument counts were correct.  The patient left the operating room in good condition.

## 2012-03-19 NOTE — Anesthesia Postprocedure Evaluation (Signed)
  Anesthesia Post-op Note  Anesthesia Post Note  Patient: Lynn Hubbard  Procedure(s) Performed: Procedure(s) (LRB): HYSTERECTOMY VAGINAL (N/A)  Anesthesia type: General  Patient location: PACU  Post pain: Pain level controlled  Post assessment: Post-op Vital signs reviewed  Last Vitals:  Filed Vitals:   03/19/12 1415  BP: 118/66  Pulse: 82  Temp:   Resp: 16    Post vital signs: Reviewed  Level of consciousness: sedated  Complications: No apparent anesthesia complications

## 2012-03-19 NOTE — Anesthesia Preprocedure Evaluation (Signed)
Anesthesia Evaluation  Patient identified by MRN, date of birth, ID band Patient awake    Reviewed: Allergy & Precautions, H&P , Patient's Chart, lab work & pertinent test results, reviewed documented beta blocker date and time   History of Anesthesia Complications Negative for: history of anesthetic complications  Airway Mallampati: III TM Distance: >3 FB Neck ROM: full    Dental no notable dental hx.    Pulmonary neg pulmonary ROS,  breath sounds clear to auscultation  Pulmonary exam normal       Cardiovascular Exercise Tolerance: Good negative cardio ROS  Rhythm:regular Rate:Normal     Neuro/Psych  Headaches, negative neurological ROS  negative psych ROS   GI/Hepatic negative GI ROS, Neg liver ROS,   Endo/Other  negative endocrine ROS  Renal/GU negative Renal ROS     Musculoskeletal   Abdominal   Peds  Hematology negative hematology ROS (+)   Anesthesia Other Findings Optic neuritis     Syncope        Restless leg syndrome     Headache   migraines    Mitral valve prolapse     Anxiety        Bipolar 1 disorder     Bipolar 1 disorder        Depression    Reproductive/Obstetrics negative OB ROS                           Anesthesia Physical Anesthesia Plan  ASA: II  Anesthesia Plan: General LMA   Post-op Pain Management:    Induction:   Airway Management Planned:   Additional Equipment:   Intra-op Plan:   Post-operative Plan:   Informed Consent: I have reviewed the patients History and Physical, chart, labs and discussed the procedure including the risks, benefits and alternatives for the proposed anesthesia with the patient or authorized representative who has indicated his/her understanding and acceptance.   Dental Advisory Given  Plan Discussed with: CRNA, Surgeon and Anesthesiologist  Anesthesia Plan Comments:         Anesthesia Quick Evaluation

## 2012-03-19 NOTE — Transfer of Care (Signed)
Immediate Anesthesia Transfer of Care Note  Patient: Lynn Hubbard  Procedure(s) Performed: Procedure(s): HYSTERECTOMY VAGINAL (N/A)  Patient Location: PACU  Anesthesia Type:General  Level of Consciousness: awake, alert  and oriented  Airway & Oxygen Therapy: Patient Spontanous Breathing and Patient connected to nasal cannula oxygen  Post-op Assessment: Report given to PACU RN and Post -op Vital signs reviewed and stable  Post vital signs: Reviewed and stable  Complications: No apparent anesthesia complications

## 2012-03-20 LAB — CBC
HCT: 35.2 % — ABNORMAL LOW (ref 36.0–46.0)
Hemoglobin: 11.5 g/dL — ABNORMAL LOW (ref 12.0–15.0)
MCV: 91.2 fL (ref 78.0–100.0)
Platelets: 171 10*3/uL (ref 150–400)
RBC: 3.86 MIL/uL — ABNORMAL LOW (ref 3.87–5.11)
WBC: 12 10*3/uL — ABNORMAL HIGH (ref 4.0–10.5)

## 2012-03-20 MED ORDER — BISACODYL 10 MG RE SUPP
10.0000 mg | Freq: Once | RECTAL | Status: AC
  Administered 2012-03-20: 10 mg via RECTAL
  Filled 2012-03-20: qty 1

## 2012-03-20 MED ORDER — OXYCODONE-ACETAMINOPHEN 5-325 MG PO TABS
2.0000 | ORAL_TABLET | Freq: Once | ORAL | Status: AC
  Administered 2012-03-20: 2 via ORAL
  Filled 2012-03-20: qty 2

## 2012-03-20 MED ORDER — OXYCODONE-ACETAMINOPHEN 5-325 MG PO TABS: 2.0000 | ORAL_TABLET | ORAL | Status: DC | PRN

## 2012-03-20 MED ORDER — SIMETHICONE 80 MG PO CHEW: 80.0000 mg | CHEWABLE_TABLET | Freq: Four times a day (QID) | ORAL | Status: DC | PRN

## 2012-03-20 NOTE — Progress Notes (Signed)
Post Op Day 1 Subjective: tolerating PO, + flatus and ready for discharge.  Patient had pain this morning but after Percocet she feels better.  Objective: Blood pressure 99/40, pulse 76, temperature 98.2 F (36.8 C), temperature source Oral, resp. rate 18, height 5\' 7"  (1.702 m), weight 100.699 kg (222 lb), last menstrual period 02/23/2012, SpO2 99.00%.  Physical Exam:  GI: soft, non-tender; bowel sounds normal; no masses,  no organomegaly   Recent Labs  03/19/12 2004 03/20/12 0525  HGB 11.7* 11.5*  HCT 34.4* 35.2*    Assessment/Plan: Discharge home   LOS: 1 day   Lynn Hubbard D 03/20/2012, 12:33 PM

## 2012-03-20 NOTE — Discharge Summary (Signed)
Physician Discharge Summary  Patient ID: Lynn Hubbard MRN: [id number] DOB/AGE: 1975-09-09 37 y.o.  Admit date: 03/19/2012 Discharge date: 03/20/2012  Admission Diagnoses: Dysmenorrhea and Menorrhagia  Discharge Diagnoses: s/p Vaginal Hysterectomy Active Problems:   * No active hospital problems. *   Discharged Condition: good  Hospital Course: Admitted for Vaginal Hysterectomy.  Surgery went without complication.  On POD 1 she her pain was controlled with oral analgesia and she was eating and passing gas.  Her vital signs were stable and her hemoglobin was stable.  Consults: None  Treatments: surgery: Vaginal Hysterectomy  Discharge Exam: Blood pressure 99/40, pulse 76, temperature 98.2 F (36.8 C), temperature source Oral, resp. rate 18, height 5\' 7"  (1.702 m), weight 100.699 kg (222 lb), last menstrual period 02/23/2012, SpO2 99.00%. GI: soft, non-tender; bowel sounds normal; no masses,  no organomegaly  Disposition: 01-Home or Self Care     Medication List    TAKE these medications       LATUDA 20 MG Tabs  Generic drug:  Lurasidone HCl  Take 1 tablet by mouth daily.     lithium carbonate 450 MG CR tablet  Commonly known as:  ESKALITH  Take 450 mg by mouth 2 (two) times daily.     multivitamin with minerals Tabs  Take 1 tablet by mouth daily.     oxyCODONE-acetaminophen 5-325 MG per tablet  Commonly known as:  ROXICET  Take 2 tablets by mouth every 4 (four) hours as needed for pain.     pramipexole 1.5 MG tablet  Commonly known as:  MIRAPEX  Take 1.5 mg by mouth at bedtime.     venlafaxine XR 150 MG 24 hr capsule  Commonly known as:  EFFEXOR-XR  Take 150 mg by mouth daily.           Follow-up Information   Follow up with Mickel Baas, MD. Schedule an appointment as soon as possible for a visit in 3 weeks.   Contact information:   [address] [place] [address] [phone]       Signed: Mickel Baas 03/20/2012,  12:43 PM

## 2012-03-20 NOTE — Progress Notes (Signed)
Pt discharged to home with husband and mother.  Condition stable.  Pt to car via wheelchair with Dolphus Jenny, NT.  No equipment for home ordered at discharge.

## 2012-03-20 NOTE — Anesthesia Postprocedure Evaluation (Signed)
  Anesthesia Post-op Note  Patient: Lynn Hubbard  Procedure(s) Performed: Procedure(s): HYSTERECTOMY VAGINAL (N/A)  Patient Location: [facility] Unit  Anesthesia Type:General  Level of Consciousness: awake  Airway and Oxygen Therapy: Patient Spontanous Breathing  Post-op Pain: none  Post-op Assessment: Patient's Cardiovascular Status Stable, Respiratory Function Stable, Patent Airway, No signs of Nausea or vomiting, Adequate PO intake, Pain level controlled, No headache, No backache, No residual numbness and No residual motor weakness  Post-op Vital Signs: Reviewed and stable  Complications: No apparent anesthesia complications

## 2012-03-21 ENCOUNTER — Encounter (HOSPITAL_COMMUNITY): Payer: Self-pay | Admitting: Obstetrics & Gynecology

## 2012-05-22 ENCOUNTER — Encounter (HOSPITAL_COMMUNITY): Payer: Self-pay | Admitting: *Deleted

## 2012-05-22 ENCOUNTER — Observation Stay (HOSPITAL_COMMUNITY)
Admission: EM | Admit: 2012-05-22 | Discharge: 2012-05-24 | Disposition: A | Discharge status: Home / Self Care | Payer: BC Managed Care – PPO | Attending: General Surgery | Admitting: General Surgery

## 2012-05-22 ENCOUNTER — Emergency Department (HOSPITAL_COMMUNITY): Payer: BC Managed Care – PPO

## 2012-05-22 DIAGNOSIS — R112 Nausea with vomiting, unspecified: Secondary | ICD-10-CM | POA: Insufficient documentation

## 2012-05-22 DIAGNOSIS — R1011 Right upper quadrant pain: Secondary | ICD-10-CM | POA: Insufficient documentation

## 2012-05-22 DIAGNOSIS — F3130 Bipolar disorder, current episode depressed, mild or moderate severity, unspecified: Secondary | ICD-10-CM | POA: Insufficient documentation

## 2012-05-22 DIAGNOSIS — K810 Acute cholecystitis: Secondary | ICD-10-CM | POA: Diagnosis present

## 2012-05-22 DIAGNOSIS — K8000 Calculus of gallbladder with acute cholecystitis without obstruction: Principal | ICD-10-CM | POA: Insufficient documentation

## 2012-05-22 DIAGNOSIS — G43909 Migraine, unspecified, not intractable, without status migrainosus: Secondary | ICD-10-CM | POA: Insufficient documentation

## 2012-05-22 DIAGNOSIS — G2581 Restless legs syndrome: Secondary | ICD-10-CM | POA: Insufficient documentation

## 2012-05-22 LAB — CBC WITH DIFFERENTIAL/PLATELET
Basophils Relative: 0 % (ref 0–1)
HCT: 39.3 % (ref 36.0–46.0)
Hemoglobin: 13.8 g/dL (ref 12.0–15.0)
MCHC: 35.1 g/dL (ref 30.0–36.0)
MCV: 86.2 fL (ref 78.0–100.0)
Monocytes Absolute: 1.1 10*3/uL — ABNORMAL HIGH (ref 0.1–1.0)
Monocytes Relative: 7 % (ref 3–12)
Neutro Abs: 9.4 10*3/uL — ABNORMAL HIGH (ref 1.7–7.7)

## 2012-05-22 LAB — COMPREHENSIVE METABOLIC PANEL
Albumin: 4.1 g/dL (ref 3.5–5.2)
BUN: 12 mg/dL (ref 6–23)
CO2: 23 mEq/L (ref 19–32)
Chloride: 104 mEq/L (ref 96–112)
Creatinine, Ser: 0.91 mg/dL (ref 0.50–1.10)
GFR calc non Af Amer: 80 mL/min — ABNORMAL LOW (ref >90)
Total Bilirubin: 0.1 mg/dL — ABNORMAL LOW (ref 0.3–1.2)

## 2012-05-22 LAB — LIPASE, BLOOD: Lipase: 30 U/L (ref 11–59)

## 2012-05-22 LAB — TROPONIN I: Troponin I: <0.3 ng/mL (ref <0.30)

## 2012-05-22 MED ORDER — SODIUM CHLORIDE 0.9 % IV BOLUS (SEPSIS)
1000.0000 mL | Freq: Once | INTRAVENOUS | Status: AC
  Administered 2012-05-22: 1000 mL via INTRAVENOUS

## 2012-05-22 MED ORDER — HYDROMORPHONE HCL PF 1 MG/ML IJ SOLN
1.0000 mg | Freq: Once | INTRAMUSCULAR | Status: AC
  Administered 2012-05-22: 1 mg via INTRAVENOUS
  Filled 2012-05-22: qty 1

## 2012-05-22 MED ORDER — PIPERACILLIN-TAZOBACTAM 3.375 G IVPB 30 MIN
3.3750 g | Freq: Once | INTRAVENOUS | Status: AC
  Administered 2012-05-22: 3.375 g via INTRAVENOUS
  Filled 2012-05-22: qty 50

## 2012-05-22 MED ORDER — MORPHINE SULFATE 4 MG/ML IJ SOLN
6.0000 mg | Freq: Once | INTRAMUSCULAR | Status: AC
  Administered 2012-05-22: 6 mg via INTRAVENOUS
  Filled 2012-05-22: qty 2

## 2012-05-22 MED ORDER — ONDANSETRON HCL 4 MG/2ML IJ SOLN
4.0000 mg | Freq: Once | INTRAMUSCULAR | Status: AC
  Administered 2012-05-22: 4 mg via INTRAVENOUS
  Filled 2012-05-22: qty 2

## 2012-05-22 NOTE — ED Notes (Signed)
Transported to [place].

## 2012-05-22 NOTE — ED Provider Notes (Signed)
37 year old female comes in with right upper quadrant pain with nausea and vomiting which started after eating a ham sandwich. She's been having similar pains intermittently for the last 2 months. On exam, she has marked tenderness in the right upper quadrant worrisome for possible biliary tract disease. She is sent for ultrasound which confirms cholecystitis and she will be admitted to surgery service.  I saw and evaluated the patient, reviewed the resident's note and I agree with the findings and plan.   Dione Booze, MD 05/22/12 [phone]

## 2012-05-22 NOTE — ED Notes (Signed)
The pt is c/o epigastric pain with nv since feb intermittently.  The pain radiates posteriorly into her chest on each side.  She stillhas her gb.  She last ate at 1700

## 2012-05-22 NOTE — H&P (Signed)
Lynn Hubbard is an 37 y.o. female.   Chief Complaint: abdominal pain HPI: 37 yo wf who began feeling bad earlier today. She has had ruq pain. It has been associated with significant nausea and vomiting. She denies fever. She had similar episode in feb and march. U/s shows gallstones with thickened gallbladder wall. LFT's and lipase are normal  Past Medical History  Diagnosis Date  . Optic neuritis   . Syncope   . Restless leg syndrome   . Headache     migraines  . Mitral valve prolapse   . Anxiety   . Bipolar 1 disorder   . Bipolar 1 disorder   . Depression     Past Surgical History  Procedure Laterality Date  . Tubal ligation    . Tonsillectomy    . Tympanostomy tube placement    . Vaginal hysterectomy N/A 03/19/2012    Procedure: HYSTERECTOMY VAGINAL;  Surgeon: Mickel Baas, MD;  Location: [facility];  Service: Gynecology;  Laterality: N/A;    No family history on file. Social History:  reports that she has been smoking Cigarettes.  She has been smoking about 0.50 packs per day. She has never used smokeless tobacco. She reports that she does not drink alcohol or use illicit drugs.  Allergies:  Allergies  Allergen Reactions  . Klonopin (Clonazepam)     Low blood pressure, syncope.     (Not in a hospital admission)  Results for orders placed during the hospital encounter of 05/22/12 (from the past 48 hour(s))  CBC WITH DIFFERENTIAL     Status: Abnormal   Collection Time    05/22/12  8:43 PM      Result Value Range   WBC 16.1 (*) 4.0 - 10.5 K/uL   RBC 4.56  3.87 - 5.11 MIL/uL   Hemoglobin 13.8  12.0 - 15.0 g/dL   HCT 16.1  09.6 - 04.5 %   MCV 86.2  78.0 - 100.0 fL   MCH 30.3  26.0 - 34.0 pg   MCHC 35.1  30.0 - 36.0 g/dL   RDW 40.9  81.1 - 91.4 %   Platelets 248  150 - 400 K/uL   Neutrophils Relative 59  43 - 77 %   Neutro Abs 9.4 (*) 1.7 - 7.7 K/uL   Lymphocytes Relative 31  12 - 46 %   Lymphs Abs 5.0 (*) 0.7 - 4.0 K/uL   Monocytes Relative 7  3 - 12 %   Monocytes Absolute 1.1 (*) 0.1 - 1.0 K/uL   Eosinophils Relative 3  0 - 5 %   Eosinophils Absolute 0.5  0.0 - 0.7 K/uL   Basophils Relative 0  0 - 1 %   Basophils Absolute 0.1  0.0 - 0.1 K/uL  COMPREHENSIVE METABOLIC PANEL     Status: Abnormal   Collection Time    05/22/12  8:43 PM      Result Value Range   Sodium 138  135 - 145 mEq/L   Potassium 3.6  3.5 - 5.1 mEq/L   Chloride 104  96 - 112 mEq/L   CO2 23  19 - 32 mEq/L   Glucose, Bld 89  70 - 99 mg/dL   BUN 12  6 - 23 mg/dL   Creatinine, Ser 7.82  0.50 - 1.10 mg/dL   Calcium 95.6  8.4 - 21.3 mg/dL   Total Protein 7.2  6.0 - 8.3 g/dL   Albumin 4.1  3.5 - 5.2 g/dL   AST 14  0 - 37 U/L   ALT 10  0 - 35 U/L   Alkaline Phosphatase 83  39 - 117 U/L   Total Bilirubin 0.1 (*) 0.3 - 1.2 mg/dL   GFR calc non Af Amer 80 (*) >90 mL/min   GFR calc Af Amer >90  >90 mL/min   Comment:            The eGFR has been calculated     using the CKD EPI equation.     This calculation has not been     validated in all clinical     situations.     eGFR's persistently     <90 mL/min signify     possible Chronic Kidney Disease.  LIPASE, BLOOD     Status: None   Collection Time    05/22/12  8:43 PM      Result Value Range   Lipase 30  11 - 59 U/L  TROPONIN I     Status: None   Collection Time    05/22/12  8:43 PM      Result Value Range   Troponin I <0.30  <0.30 ng/mL   Comment:            Due to the release kinetics of cTnI,     a negative result within the first hours     of the onset of symptoms does not rule out     myocardial infarction with certainty.     If myocardial infarction is still suspected,     repeat the test at appropriate intervals.   US Abdomen Complete  05/22/2012  *RADIOLOGY REPORT*  Clinical Data:  Right upper quadrant abdominal pain  COMPLETE ABDOMINAL ULTRASOUND  Comparison:  None  Findings:  Gallbladder:  Multiple gallstones.  Mild wall thickening at 3.2 mm. Positive sonographic Murphy's sign.  Common bile duct:   Prominent distally up to 6 mm.  Proximal portion obscured by artifact.  Liver:  Increased in echogenicity, in keeping with fatty infiltration.  Focal lesion detection limited in this setting.  IVC:  Appears normal.  Pancreas:  No focal abnormality seen within the head/neck.  The tail and body are obscured by bowel gas artifact.  Spleen:  Measures 7 cm oblique.  No focal abnormality.  Right Kidney:  Measures 11.2 cm.  No hydronephrosis or focal abnormality.  Left Kidney:  Measures 11.2 cm.  No hydronephrosis or focal abnormality.  Abdominal aorta:  No aneurysm identified.  IMPRESSION: Gallstones, mild gallbladder wall thickening, and positive sonographic Murphy's sign; suspicious for acute cholecystitis.  Mild CBD prominence at 6 mm.  Obstructing lesion not visualized. Correlate with LFTs and consider ERCP or intraoperative cholangiogram if the patient undergoes surgery.  Hepatic steatosis.   Original Report Authenticated By: Jearld Lesch, M.D.     Review of Systems  Constitutional: Negative.   HENT: Negative.   Eyes: Negative.   Respiratory: Negative.   Cardiovascular: Negative.   Gastrointestinal: Positive for nausea, vomiting and abdominal pain.  Genitourinary: Negative.   Musculoskeletal: Negative.   Skin: Negative.   Neurological: Negative.   Endo/Heme/Allergies: Negative.   Psychiatric/Behavioral: Negative.     Blood pressure 124/63, pulse 73, temperature 99.2 F (37.3 C), temperature source Oral, resp. rate 20, SpO2 98.00%. Physical Exam  Constitutional: She is oriented to person, place, and time. She appears well-developed and well-nourished.  HENT:  Head: Normocephalic and atraumatic.  Eyes: Conjunctivae and EOM are normal. Pupils are equal, round, and reactive to light.  Neck:  Normal range of motion. Neck supple.  Cardiovascular: Normal rate, regular rhythm and normal heart sounds.   Respiratory: Effort normal and breath sounds normal.  GI: Soft.  Tender in ruq. No guarding  or palpable mass  Musculoskeletal: Normal range of motion.  Neurological: She is alert and oriented to person, place, and time.  Skin: Skin is warm and dry.  Psychiatric: She has a normal mood and affect. Her behavior is normal.     Assessment/Plan The pt appears to have cholecystitis with cholelithiasis. Will plan to admit for bowel rest and abx. She will probably benefit from having her gallbladder removed during this hospitalization.  TOTH III,PAUL S 05/22/2012, 11:54 PM

## 2012-05-22 NOTE — ED Notes (Signed)
Pt c/o upper abd pain, radiates into back, states she cannot get comfortable  Pain gets up to 8/10. Pt states it has happened intermittently since February. Pt states abd is swollen. Pt lbm was yesterday, was regular.

## 2012-05-22 NOTE — ED Provider Notes (Signed)
History     CSN: [id number]  Arrival date & time 05/22/12  2008   First MD Initiated Contact with Patient 05/22/12 2149      Chief Complaint  Patient presents with  . Abdominal Pain    Patient is a 37 y.o. female presenting with abdominal pain. The history is provided by the patient and the spouse.  Abdominal Pain Pain location:  RUQ Pain quality: aching and fullness   Pain radiates to:  Does not radiate Pain severity:  Moderate Onset quality:  Gradual Duration:  4 hours Timing:  Constant Progression:  Worsening Chronicity:  New (History of similar pain in RUQ; never this severe) Context comment:  Overweight; worse with fatty foods Relieved by:  Nothing Worsened by:  Nothing tried Ineffective treatments:  None tried Associated symptoms: anorexia, chills, fever, nausea and vomiting   Associated symptoms: no chest pain, no constipation, no cough, no diarrhea, no dysuria and no shortness of breath     Past Medical History  Diagnosis Date  . Optic neuritis   . Syncope   . Restless leg syndrome   . Headache     migraines  . Mitral valve prolapse   . Anxiety   . Bipolar 1 disorder   . Bipolar 1 disorder   . Depression     Past Surgical History  Procedure Laterality Date  . Tubal ligation    . Tonsillectomy    . Tympanostomy tube placement    . Vaginal hysterectomy N/A 03/19/2012    Procedure: HYSTERECTOMY VAGINAL;  Surgeon: Mickel Baas, MD;  Location: [facility];  Service: Gynecology;  Laterality: N/A;    No family history on file.  History  Substance Use Topics  . Smoking status: Current Every Day Smoker -- 0.50 packs/day    Types: Cigarettes  . Smokeless tobacco: Never Used  . Alcohol Use: No    OB History   Grav Para Term Preterm Abortions TAB SAB Ect Mult Living   3         3      Review of Systems  Constitutional: Positive for fever, chills, activity change and appetite change.  HENT: Negative for neck pain and neck stiffness.   Respiratory:  Negative for cough, chest tightness, shortness of breath and wheezing.   Cardiovascular: Negative for chest pain and palpitations.  Gastrointestinal: Positive for nausea, vomiting, abdominal pain and anorexia. Negative for diarrhea, constipation, blood in stool, abdominal distention and anal bleeding.  Genitourinary: Negative for dysuria, decreased urine volume and difficulty urinating.  Skin: Negative for rash and wound.  Neurological: Negative for seizures, syncope, facial asymmetry and light-headedness.  Psychiatric/Behavioral: Negative for confusion and agitation.  All other systems reviewed and are negative.    Allergies  Klonopin  Home Medications   Current Outpatient Rx  Name  Route  Sig  Dispense  Refill  . lithium carbonate (ESKALITH) 450 MG CR tablet   Oral   Take 450 mg by mouth 2 (two) times daily.         . Multiple Vitamin (MULTIVITAMIN WITH MINERALS) TABS   Oral   Take 1 tablet by mouth daily.         . pramipexole (MIRAPEX) 1.5 MG tablet   Oral   Take 1.5 mg by mouth at bedtime.         Marland Kitchen venlafaxine XR (EFFEXOR-XR) 150 MG 24 hr capsule   Oral   Take 150 mg by mouth daily.  BP 133/80  Pulse 76  Temp(Src) 99.2 F (37.3 C) (Oral)  Resp 16  SpO2 97%  Physical Exam  Nursing note and vitals reviewed. Constitutional: She is oriented to person, place, and time. She appears well-developed and well-nourished.  HENT:  Head: Normocephalic and atraumatic.  Right Ear: External ear normal.  Left Ear: External ear normal.  Nose: Nose normal.  Mouth/Throat: Oropharynx is clear and moist. No oropharyngeal exudate.  Eyes: Conjunctivae are normal. Pupils are equal, round, and reactive to light.  Neck: Normal range of motion. Neck supple.  Cardiovascular: Normal rate, regular rhythm, normal heart sounds and intact distal pulses.  Exam reveals no gallop and no friction rub.   No murmur heard. Pulmonary/Chest: Effort normal and breath sounds normal.  No respiratory distress. She has no wheezes. She has no rales. She exhibits no tenderness.  Abdominal: Soft. Bowel sounds are normal. She exhibits no distension and no mass. There is tenderness (RUQ). There is no rebound and no guarding.  Musculoskeletal: Normal range of motion. She exhibits no edema and no tenderness.  Neurological: She is alert and oriented to person, place, and time.  Skin: Skin is warm and dry.  Psychiatric: She has a normal mood and affect. Her behavior is normal. Judgment and thought content normal.    ED Course  Procedures (including critical care time)  Labs Reviewed  CBC WITH DIFFERENTIAL - Abnormal; Notable for the following:    WBC 16.1 (*)    Neutro Abs 9.4 (*)    Lymphs Abs 5.0 (*)    Monocytes Absolute 1.1 (*)    All other components within normal limits  COMPREHENSIVE METABOLIC PANEL - Abnormal; Notable for the following:    Total Bilirubin 0.1 (*)    GFR calc non Af Amer 80 (*)    All other components within normal limits  LIPASE, BLOOD  TROPONIN I   US Abdomen Complete  05/22/2012  *RADIOLOGY REPORT*  Clinical Data:  Right upper quadrant abdominal pain  COMPLETE ABDOMINAL ULTRASOUND  Comparison:  None  Findings:  Gallbladder:  Multiple gallstones.  Mild wall thickening at 3.2 mm. Positive sonographic Murphy's sign.  Common bile duct:  Prominent distally up to 6 mm.  Proximal portion obscured by artifact.  Liver:  Increased in echogenicity, in keeping with fatty infiltration.  Focal lesion detection limited in this setting.  IVC:  Appears normal.  Pancreas:  No focal abnormality seen within the head/neck.  The tail and body are obscured by bowel gas artifact.  Spleen:  Measures 7 cm oblique.  No focal abnormality.  Right Kidney:  Measures 11.2 cm.  No hydronephrosis or focal abnormality.  Left Kidney:  Measures 11.2 cm.  No hydronephrosis or focal abnormality.  Abdominal aorta:  No aneurysm identified.  IMPRESSION: Gallstones, mild gallbladder wall  thickening, and positive sonographic Murphy's sign; suspicious for acute cholecystitis.  Mild CBD prominence at 6 mm.  Obstructing lesion not visualized. Correlate with LFTs and consider ERCP or intraoperative cholangiogram if the patient undergoes surgery.  Hepatic steatosis.   Original Report Authenticated By: Jearld Lesch, M.D.      Clinical Impression: Acute cholecystitis   MDM  37 yo F presents for several hours of RUQ abdominal pain and nausea; has had similar pain in past with greasy foods, but never to this severity. Denies fever/chills. AFVSS. Exquisitely tender over RUQ of abdomen. Not peritonitic. Will obtain RUQ ultrasound to rule out acute cholecystitis and treat pain and nausea symptomatically.  Imaging confirms acute cholecystitis.  Blood cultures obtained and Zosyn administered. Surgery to admit. Pt remains normotensive on admission to floor.         Clemetine Marker, MD 05/23/12 [phone]

## 2012-05-23 ENCOUNTER — Observation Stay (HOSPITAL_COMMUNITY): Payer: BC Managed Care – PPO

## 2012-05-23 ENCOUNTER — Encounter (HOSPITAL_COMMUNITY): Payer: Self-pay | Admitting: Anesthesiology

## 2012-05-23 ENCOUNTER — Encounter (HOSPITAL_COMMUNITY)
Admission: EM | Disposition: A | Discharge status: Home / Self Care | Payer: Self-pay | Source: Home / Self Care | Attending: Emergency Medicine

## 2012-05-23 ENCOUNTER — Observation Stay (HOSPITAL_COMMUNITY): Payer: BC Managed Care – PPO | Admitting: Anesthesiology

## 2012-05-23 ENCOUNTER — Encounter (HOSPITAL_COMMUNITY): Payer: Self-pay | Admitting: General Practice

## 2012-05-23 DIAGNOSIS — K810 Acute cholecystitis: Secondary | ICD-10-CM | POA: Diagnosis present

## 2012-05-23 HISTORY — PX: CHOLECYSTECTOMY: SHX55

## 2012-05-23 LAB — SURGICAL PCR SCREEN
MRSA, PCR: NEGATIVE
Staphylococcus aureus: NEGATIVE

## 2012-05-23 SURGERY — LAPAROSCOPIC CHOLECYSTECTOMY WITH INTRAOPERATIVE CHOLANGIOGRAM
Anesthesia: General | Site: Abdomen | Wound class: Dirty or Infected

## 2012-05-23 MED ORDER — ONDANSETRON HCL 4 MG/2ML IJ SOLN
INTRAMUSCULAR | Status: DC | PRN
  Administered 2012-05-23: 4 mg via INTRAVENOUS

## 2012-05-23 MED ORDER — SODIUM CHLORIDE 0.9 % IR SOLN
Status: DC | PRN
  Administered 2012-05-23: 1000 mL

## 2012-05-23 MED ORDER — HYDROMORPHONE HCL PF 1 MG/ML IJ SOLN
0.2500 mg | INTRAMUSCULAR | Status: DC | PRN
  Administered 2012-05-23: 0.5 mg via INTRAVENOUS

## 2012-05-23 MED ORDER — HYDROMORPHONE HCL PF 1 MG/ML IJ SOLN
INTRAMUSCULAR | Status: DC | PRN
  Administered 2012-05-23: 0.5 mg via INTRAVENOUS

## 2012-05-23 MED ORDER — OXYCODONE HCL 5 MG PO TABS
5.0000 mg | ORAL_TABLET | ORAL | Status: DC | PRN
  Administered 2012-05-23 – 2012-05-24 (×4): 10 mg via ORAL
  Filled 2012-05-23 (×4): qty 2

## 2012-05-23 MED ORDER — VENLAFAXINE HCL ER 150 MG PO CP24
150.0000 mg | ORAL_CAPSULE | Freq: Every day | ORAL | Status: DC
  Administered 2012-05-23 – 2012-05-24 (×2): 150 mg via ORAL
  Filled 2012-05-23 (×2): qty 1

## 2012-05-23 MED ORDER — ONDANSETRON HCL 4 MG/2ML IJ SOLN: 4.0000 mg | Freq: Four times a day (QID) | INTRAMUSCULAR | Status: DC | PRN

## 2012-05-23 MED ORDER — HYDROMORPHONE HCL PF 1 MG/ML IJ SOLN
1.0000 mg | INTRAMUSCULAR | Status: DC | PRN
  Administered 2012-05-23 (×4): 1 mg via INTRAVENOUS
  Filled 2012-05-23 (×4): qty 1

## 2012-05-23 MED ORDER — PANTOPRAZOLE SODIUM 40 MG IV SOLR
40.0000 mg | Freq: Every day | INTRAVENOUS | Status: DC
  Administered 2012-05-23 (×2): 40 mg via INTRAVENOUS
  Filled 2012-05-23 (×3): qty 40

## 2012-05-23 MED ORDER — GLYCOPYRROLATE 0.2 MG/ML IJ SOLN
INTRAMUSCULAR | Status: DC | PRN
  Administered 2012-05-23: .8 mg via INTRAVENOUS

## 2012-05-23 MED ORDER — OXYCODONE HCL 5 MG PO TABS
5.0000 mg | ORAL_TABLET | Freq: Once | ORAL | Status: AC | PRN
  Administered 2012-05-23: 5 mg via ORAL
  Filled 2012-05-23: qty 1

## 2012-05-23 MED ORDER — 0.9 % SODIUM CHLORIDE (POUR BTL) OPTIME
TOPICAL | Status: DC | PRN
  Administered 2012-05-23: 1000 mL

## 2012-05-23 MED ORDER — MIDAZOLAM HCL 5 MG/5ML IJ SOLN
INTRAMUSCULAR | Status: DC | PRN
  Administered 2012-05-23: 2 mg via INTRAVENOUS

## 2012-05-23 MED ORDER — LACTATED RINGERS IV SOLN
INTRAVENOUS | Status: DC | PRN
  Administered 2012-05-23 (×2): via INTRAVENOUS

## 2012-05-23 MED ORDER — PIPERACILLIN-TAZOBACTAM 3.375 G IVPB
3.3750 g | Freq: Three times a day (TID) | INTRAVENOUS | Status: DC
  Administered 2012-05-23 – 2012-05-24 (×4): 3.375 g via INTRAVENOUS
  Filled 2012-05-23 (×7): qty 50

## 2012-05-23 MED ORDER — FENTANYL CITRATE 0.05 MG/ML IJ SOLN
INTRAMUSCULAR | Status: DC | PRN
  Administered 2012-05-23 (×2): 100 ug via INTRAVENOUS
  Administered 2012-05-23: 50 ug via INTRAVENOUS

## 2012-05-23 MED ORDER — LITHIUM CARBONATE ER 450 MG PO TBCR
450.0000 mg | EXTENDED_RELEASE_TABLET | Freq: Two times a day (BID) | ORAL | Status: DC
  Administered 2012-05-23 – 2012-05-24 (×4): 450 mg via ORAL
  Filled 2012-05-23 (×5): qty 1

## 2012-05-23 MED ORDER — KCL IN DEXTROSE-NACL 20-5-0.9 MEQ/L-%-% IV SOLN
INTRAVENOUS | Status: DC
  Administered 2012-05-23 – 2012-05-24 (×3): via INTRAVENOUS
  Filled 2012-05-23 (×5): qty 1000

## 2012-05-23 MED ORDER — METOCLOPRAMIDE HCL 5 MG/ML IJ SOLN
10.0000 mg | Freq: Once | INTRAMUSCULAR | Status: AC | PRN
  Administered 2012-05-23: 10 mg via INTRAVENOUS

## 2012-05-23 MED ORDER — LACTATED RINGERS IV SOLN
INTRAVENOUS | Status: DC
  Administered 2012-05-23: 50 mL/h via INTRAVENOUS

## 2012-05-23 MED ORDER — METOCLOPRAMIDE HCL 5 MG/ML IJ SOLN
INTRAMUSCULAR | Status: AC
  Filled 2012-05-23: qty 2

## 2012-05-23 MED ORDER — BUPIVACAINE-EPINEPHRINE 0.25% -1:200000 IJ SOLN
INTRAMUSCULAR | Status: DC | PRN
  Administered 2012-05-23: 20 mL

## 2012-05-23 MED ORDER — LIDOCAINE HCL 4 % MT SOLN
OROMUCOSAL | Status: DC | PRN
  Administered 2012-05-23: 4 mL via TOPICAL

## 2012-05-23 MED ORDER — IOHEXOL 300 MG/ML  SOLN
INTRAMUSCULAR | Status: DC | PRN
  Administered 2012-05-23: 5 mL via INTRAVENOUS

## 2012-05-23 MED ORDER — PROPOFOL 10 MG/ML IV BOLUS
INTRAVENOUS | Status: DC | PRN
  Administered 2012-05-23: 200 mg via INTRAVENOUS

## 2012-05-23 MED ORDER — MORPHINE SULFATE 4 MG/ML IJ SOLN
4.0000 mg | INTRAMUSCULAR | Status: DC | PRN
  Administered 2012-05-23 (×2): 4 mg via INTRAVENOUS
  Filled 2012-05-23 (×2): qty 1

## 2012-05-23 MED ORDER — ROCURONIUM BROMIDE 100 MG/10ML IV SOLN
INTRAVENOUS | Status: DC | PRN
  Administered 2012-05-23: 50 mg via INTRAVENOUS

## 2012-05-23 MED ORDER — DEXAMETHASONE SODIUM PHOSPHATE 4 MG/ML IJ SOLN
INTRAMUSCULAR | Status: DC | PRN
  Administered 2012-05-23: 4 mg via INTRAVENOUS

## 2012-05-23 MED ORDER — LIDOCAINE HCL (CARDIAC) 20 MG/ML IV SOLN
INTRAVENOUS | Status: DC | PRN
  Administered 2012-05-23: 100 mg via INTRAVENOUS

## 2012-05-23 MED ORDER — OXYCODONE HCL 5 MG/5ML PO SOLN: 5.0000 mg | Freq: Once | ORAL | Status: AC | PRN

## 2012-05-23 MED ORDER — NEOSTIGMINE METHYLSULFATE 1 MG/ML IJ SOLN
INTRAMUSCULAR | Status: DC | PRN
  Administered 2012-05-23: 5 mg via INTRAVENOUS

## 2012-05-23 SURGICAL SUPPLY — 50 items
ADH SKN CLS APL DERMABOND .7 (GAUZE/BANDAGES/DRESSINGS) ×2
APPLIER CLIP 5 13 M/L LIGAMAX5 (MISCELLANEOUS) ×3
APPLIER CLIP ROT 10 11.4 M/L (STAPLE)
APR CLP MED LRG 11.4X10 (STAPLE)
APR CLP MED LRG 5 ANG JAW (MISCELLANEOUS) ×2
BAG SPEC RTRVL LRG 6X4 10 (ENDOMECHANICALS)
BLADE SURG ROTATE 9660 (MISCELLANEOUS) ×3 IMPLANT
CANISTER SUCTION 2500CC (MISCELLANEOUS) ×3 IMPLANT
CHLORAPREP W/TINT 26ML (MISCELLANEOUS) ×3 IMPLANT
CLIP APPLIE 5 13 M/L LIGAMAX5 (MISCELLANEOUS) ×2 IMPLANT
CLIP APPLIE ROT 10 11.4 M/L (STAPLE) IMPLANT
CLOTH BEACON ORANGE TIMEOUT ST (SAFETY) ×3 IMPLANT
COVER MAYO STAND STRL (DRAPES) ×3 IMPLANT
COVER SURGICAL LIGHT HANDLE (MISCELLANEOUS) ×3 IMPLANT
DECANTER SPIKE VIAL GLASS SM (MISCELLANEOUS) ×3 IMPLANT
DERMABOND ADVANCED (GAUZE/BANDAGES/DRESSINGS) ×1
DERMABOND ADVANCED .7 DNX12 (GAUZE/BANDAGES/DRESSINGS) ×2 IMPLANT
DRAPE C-ARM 42X72 X-RAY (DRAPES) ×2 IMPLANT
DRAPE UTILITY 15X26 W/TAPE STR (DRAPE) ×6 IMPLANT
ELECT REM PT RETURN 9FT ADLT (ELECTROSURGICAL) ×3
ELECTRODE REM PT RTRN 9FT ADLT (ELECTROSURGICAL) ×2 IMPLANT
GLOVE BIO SURGEON STRL SZ 6.5 (GLOVE) ×3 IMPLANT
GLOVE BIO SURGEON STRL SZ8 (GLOVE) ×6 IMPLANT
GLOVE BIOGEL PI IND STRL 7.0 (GLOVE) ×4 IMPLANT
GLOVE BIOGEL PI IND STRL 8 (GLOVE) ×4 IMPLANT
GLOVE BIOGEL PI INDICATOR 7.0 (GLOVE) ×2
GLOVE BIOGEL PI INDICATOR 8 (GLOVE) ×2
GLOVE ECLIPSE 7.5 STRL STRAW (GLOVE) ×3 IMPLANT
GLOVE SURG SS PI 7.0 STRL IVOR (GLOVE) ×3 IMPLANT
GOWN PREVENTION PLUS XLARGE (GOWN DISPOSABLE) ×6 IMPLANT
GOWN STRL NON-REIN LRG LVL3 (GOWN DISPOSABLE) ×9 IMPLANT
KIT BASIN OR (CUSTOM PROCEDURE TRAY) ×3 IMPLANT
KIT ROOM TURNOVER OR (KITS) ×3 IMPLANT
NS IRRIG 1000ML POUR BTL (IV SOLUTION) ×3 IMPLANT
PAD ARMBOARD 7.5X6 YLW CONV (MISCELLANEOUS) ×6 IMPLANT
POUCH SPECIMEN RETRIEVAL 10MM (ENDOMECHANICALS) IMPLANT
SCISSORS LAP 5X35 DISP (ENDOMECHANICALS) IMPLANT
SET CHOLANGIOGRAPH 5 50 .035 (SET/KITS/TRAYS/PACK) ×3 IMPLANT
SET IRRIG TUBING LAPAROSCOPIC (IRRIGATION / IRRIGATOR) ×3 IMPLANT
SLEEVE ENDOPATH XCEL 5M (ENDOMECHANICALS) ×6 IMPLANT
SPECIMEN JAR SMALL (MISCELLANEOUS) ×3 IMPLANT
SUT MNCRL AB 4-0 PS2 18 (SUTURE) ×3 IMPLANT
SUT VICRYL 0 UR6 27IN ABS (SUTURE) IMPLANT
TOWEL OR 17X24 6PK STRL BLUE (TOWEL DISPOSABLE) ×3 IMPLANT
TOWEL OR 17X26 10 PK STRL BLUE (TOWEL DISPOSABLE) ×3 IMPLANT
TRAY LAPAROSCOPIC (CUSTOM PROCEDURE TRAY) ×3 IMPLANT
TROCAR XCEL BLUNT TIP 100MML (ENDOMECHANICALS) ×3 IMPLANT
TROCAR XCEL NON-BLD 11X100MML (ENDOMECHANICALS) IMPLANT
TROCAR XCEL NON-BLD 5MMX100MML (ENDOMECHANICALS) ×3 IMPLANT
WATER STERILE IRR 1000ML POUR (IV SOLUTION) IMPLANT

## 2012-05-23 NOTE — Anesthesia Preprocedure Evaluation (Addendum)
Anesthesia Evaluation  Patient identified by MRN, date of birth, ID band Patient awake    Reviewed: Allergy & Precautions, H&P , NPO status , Patient's Chart, lab work & pertinent test results, reviewed documented beta blocker date and time   History of Anesthesia Complications Negative for: history of anesthetic complications  Airway Mallampati: II TM Distance: >3 FB Neck ROM: full    Dental  (+) Teeth Intact and Dental Advisory Given   Pulmonary Current Smoker,  breath sounds clear to auscultation        Cardiovascular negative cardio ROS  Rhythm:regular     Neuro/Psych  Headaches, PSYCHIATRIC DISORDERS Anxiety Depression Bipolar Disorder Restless leg syndrome    GI/Hepatic negative GI ROS, Neg liver ROS, GB dz   Endo/Other  Morbid obesity  Renal/GU negative Renal ROS  negative genitourinary   Musculoskeletal   Abdominal (+) + obese,   Peds  Hematology negative hematology ROS (+)   Anesthesia Other Findings See surgeon's H&P   Reproductive/Obstetrics negative OB ROS                          Anesthesia Physical Anesthesia Plan  ASA: II  Anesthesia Plan: General   Post-op Pain Management:    Induction: Intravenous  Airway Management Planned: Oral ETT  Additional Equipment:   Intra-op Plan:   Post-operative Plan: Extubation in OR  Informed Consent: I have reviewed the patients History and Physical, chart, labs and discussed the procedure including the risks, benefits and alternatives for the proposed anesthesia with the patient or authorized representative who has indicated his/her understanding and acceptance.   Dental Advisory Given  Plan Discussed with: CRNA and Surgeon  Anesthesia Plan Comments:         Anesthesia Quick Evaluation

## 2012-05-23 NOTE — Progress Notes (Signed)
Returned to room from [employer], sleepy but easily arousable, reoriented to room and surroundings,denies nausea/pain, moved self from stretcher to bed without difficulty

## 2012-05-23 NOTE — Transfer of Care (Signed)
Immediate Anesthesia Transfer of Care Note  Patient: Lynn Hubbard  Procedure(s) Performed: Procedure(s): LAPAROSCOPIC CHOLECYSTECTOMY WITH INTRAOPERATIVE CHOLANGIOGRAM (N/A)  Patient Location: PACU  Anesthesia Type:General  Level of Consciousness: awake, alert  and patient cooperative  Airway & Oxygen Therapy: Patient Spontanous Breathing and Patient connected to nasal cannula oxygen  Post-op Assessment: Report given to PACU RN and Post -op Vital signs reviewed and stable  Post vital signs: Reviewed and stable  Complications: No apparent anesthesia complications

## 2012-05-23 NOTE — Anesthesia Procedure Notes (Signed)
Procedure Name: Intubation Date/Time: 05/23/2012 1:09 PM Performed by: Leona Singleton A Pre-anesthesia Checklist: Patient identified Patient Re-evaluated:Patient Re-evaluated prior to inductionOxygen Delivery Method: Circle system utilized Preoxygenation: Pre-oxygenation with 100% oxygen Intubation Type: IV induction Ventilation: Mask ventilation without difficulty Laryngoscope Size: Miller and 2 Grade View: Grade I Tube type: Oral Tube size: 7.0 mm Number of attempts: 1 Airway Equipment and Method: Stylet and LTA kit utilized Placement Confirmation: ETT inserted through vocal cords under direct vision,  positive ETCO2 and breath sounds checked- equal and bilateral Secured at: 21 cm Tube secured with: Tape Dental Injury: Teeth and Oropharynx as per pre-operative assessment

## 2012-05-23 NOTE — Anesthesia Postprocedure Evaluation (Signed)
  Anesthesia Post-op Note  Patient: Lynn Hubbard  Procedure(s) Performed: Procedure(s): LAPAROSCOPIC CHOLECYSTECTOMY WITH INTRAOPERATIVE CHOLANGIOGRAM (N/A)  Patient Location: PACU  Anesthesia Type:General  Level of Consciousness: awake, alert  and oriented  Airway and Oxygen Therapy: Patient Spontanous Breathing  Post-op Pain: mild  Post-op Assessment: Post-op Vital signs reviewed  Post-op Vital Signs: Reviewed  Complications: No apparent anesthesia complications

## 2012-05-23 NOTE — Preoperative (Signed)
Beta Blockers   Reason not to administer Beta Blockers:Not Applicable 

## 2012-05-23 NOTE — ED Notes (Signed)
Attempted to call report to floor, nurse unavailable.  Left my number for them to call back.

## 2012-05-23 NOTE — Progress Notes (Signed)
37yo female c/o RUQ pain associated with significant N/V, U/S shows gallstones with thickened gallbladder wall, plan to admit for bowel rest and likely gallbladder removal.  Will begin Zosyn 3.375g IV Q8H for CrCl >100.  Vernard Gambles, PharmD, BCPS  05/23/2012 12:09 AM

## 2012-05-23 NOTE — Progress Notes (Signed)
Spoke with patient in detail about upcoming procedure. Will proceed about 12N.  Still having pain.  Lap chole with IOC planned.  LFTs are normal.  Marta Lamas. Gae Bon, MD, FACS [phone] [facility]

## 2012-05-23 NOTE — Progress Notes (Signed)
Admitted pt to rm 6N30C from ED, oriented to room, call bell placed within reach, admission assessment done, orders carried out. Will continue to monitor. Filed Vitals:   05/23/12 0049  BP: 115/59  Pulse: 71  Temp: 97.9 F (36.6 C)  Resp: 17  Janellie Tennison, Madelaine Bhat, RN

## 2012-05-23 NOTE — Op Note (Signed)
OPERATIVE REPORT  DATE OF OPERATION: 05/22/2012 - 05/23/2012  PATIENT:  Lynn Hubbard  37 y.o. female  PRE-OPERATIVE DIAGNOSIS:  Cholecystitis, acute [575.0]  POST-OPERATIVE DIAGNOSIS:  Cholecystitis, acute  PROCEDURE:  Procedure(s): LAPAROSCOPIC CHOLECYSTECTOMY WITH INTRAOPERATIVE CHOLANGIOGRAM  SURGEON:  Surgeon(s): Cherylynn Ridges, MD  ASSISTANT: Emina Riebock, PA-C  ANESTHESIA:   general  EBL: <30 ml  BLOOD ADMINISTERED: none  DRAINS: none   SPECIMEN:  Source of Specimen:  Gallbladder and stones  COUNTS CORRECT:  YES  PROCEDURE DETAILS: The patient was taken to the operating room and placed on the table in the supine position.  After an adequate endotracheal anesthetic was administered, she was prepped with ChloroPrep, and then draped in the usual manner exposing the entire abdomen laterally, inferiorly and up  to the costal margins.  After a proper timeout was performed including identifying the patient and the procedure to be performed, a supra-umbilical 1.5cm midline incision was made using a #15 blade.  This was taken down to the fascia which was then incised with a #15 blade.  The edges of the fascia were tented up with Kocher clamps as the preperitoneal space was penetrated with a Kelly clamp into the peritoneum.  Once this was done, a pursestring suture of 0 Vicryl was passed around the fascial opening.  This was subsequently used to secure the [facility] cannula which was passed into the peritoneal cavity.  Once the [facility] cannula was in place, carbon dioxide gas was insufflated into the peritoneal cavity up to a maximal intra-abdominal pressure of 15mm Hg.The laparoscope, with attached camera and light source, was passed into the peritoneal cavity to visualize the direct insertion of two right upper quadrant 5mm cannulas, and a sup-xiphoid 5mm cannula.  Once all cannulas were in place, the dissection was begun.  Two ratcheted graspers were attached to the dome and infundibulum  of the gallbladder and retracted towards the anterior abdominal wall and the right upper quadrant.  Using cautery attached to a dissecting forceps, the peritoneum overlaying the triangle of Chalot and the hepatoduodenal triangle was dissected away exposing the cystic duct and the cystic artery.  A clip was placed on the gallbladder side of the cystic duct, then a cholecytodochotomy made using the laparoscopic scissors.  Through the cholecystodochotomy a Cook catheter was passed to performed a cholangiogram.  The cholangiogram showed good flow into the duodenum, good proximal filling with the exception of the left ductal system (not enough back pressure)., no intraductal filling defects, and no dilatation.  Once the cholangiogram was completed, the [facility] catheter was removed, and the distal cystic duct was clipped multiple times then transected.  The gallbladder was then dissected out of the hepatic bed without event.  It was retrieved from the abdomen (using an EndoCatch bag) without event.  Once the gallbladder was removed, the bed was inspected for hemostasis.  Once excellent hemostasis was obtained all gas and fluids were aspirated from above the liver, then the cannulas were removed.  The supra-umbilical incision was closed using the pursestring suture which was in place.  0.25% bupivicaine with epinephrine was injected at all sites.  All 10mm or greater cannula sites were close using a running subcuticular stitch of 4-0 Monocryl.  5.24mm cannula sites were closed with Dermabond only.Steri-Strips and Tagaderm were used to complete the dressings at all sites.  At this point all needle, sponge, and instrument counts were correct.The patient was awakened from anesthesia and taken to the PACU in stable condition.  PATIENT  DISPOSITION:  PACU - hemodynamically stable.   Cherylynn Ridges 4/25/20142:39 PM

## 2012-05-24 ENCOUNTER — Encounter (INDEPENDENT_AMBULATORY_CARE_PROVIDER_SITE_OTHER): Payer: Self-pay | Admitting: Surgery

## 2012-05-24 MED ORDER — OXYCODONE HCL 5 MG PO TABS: 5.0000 mg | ORAL_TABLET | Freq: Four times a day (QID) | ORAL | Status: DC | PRN

## 2012-05-24 MED ORDER — AMOXICILLIN-POT CLAVULANATE 875-125 MG PO TABS: 1.0000 | ORAL_TABLET | Freq: Two times a day (BID) | ORAL | Status: DC

## 2012-05-24 NOTE — Discharge Summary (Signed)
Physician Discharge Summary  Patient ID:  Lynn Hubbard  MRN: [id number]  DOB/AGE: [birth year] 37 y.o.  Admit date: 05/22/2012 Discharge date: 05/24/2012  Discharge Diagnoses:  1.  Acute cholecystitis with cholelithiasis   2.  Restless leg syndrome  3.  History of Headache, migraines   4.  History of Anxiety   5.  History of Bipolar 1 disorder  6.  History of Depression    Operation: Procedure(s): LAPAROSCOPIC CHOLECYSTECTOMY WITH INTRAOPERATIVE CHOLANGIOGRAM on  05/23/2012 - Dr. Lindie Spruce  Discharged Condition: good  Hospital Course: Lynn Hubbard is an 37 y.o. female whose primary care physician is Hollice Espy, MD and who was admitted 05/22/2012 with a chief complaint of abdominal pain secondary to cholecystitis.   She was brought to the operating room on 05/22/2012 - 05/23/2012 and underwent  LAPAROSCOPIC CHOLECYSTECTOMY WITH INTRAOPERATIVE CHOLANGIOGRAM.  She is now one day post op. She is sore, but doing well.  Her husband is at the bedside.  She is ready for discharge.   The discharge instructions were reviewed with the patient.  Consults: None  Significant Diagnostic Studies: Results for orders placed during the hospital encounter of 05/22/12  SURGICAL PCR SCREEN      Result Value Range   MRSA, PCR NEGATIVE  NEGATIVE   Staphylococcus aureus NEGATIVE  NEGATIVE  CBC WITH DIFFERENTIAL      Result Value Range   WBC 16.1 (*) 4.0 - 10.5 K/uL   RBC 4.56  3.87 - 5.11 MIL/uL   Hemoglobin 13.8  12.0 - 15.0 g/dL   HCT 40.9  81.1 - 91.4 %   MCV 86.2  78.0 - 100.0 fL   MCH 30.3  26.0 - 34.0 pg   MCHC 35.1  30.0 - 36.0 g/dL   RDW 78.2  95.6 - 21.3 %   Platelets 248  150 - 400 K/uL   Neutrophils Relative 59  43 - 77 %   Neutro Abs 9.4 (*) 1.7 - 7.7 K/uL   Lymphocytes Relative 31  12 - 46 %   Lymphs Abs 5.0 (*) 0.7 - 4.0 K/uL   Monocytes Relative 7  3 - 12 %   Monocytes Absolute 1.1 (*) 0.1 - 1.0 K/uL   Eosinophils Relative 3  0 - 5 %   Eosinophils Absolute 0.5  0.0 - 0.7 K/uL   Basophils Relative 0  0 - 1 %   Basophils Absolute 0.1  0.0 - 0.1 K/uL  COMPREHENSIVE METABOLIC PANEL      Result Value Range   Sodium 138  135 - 145 mEq/L   Potassium 3.6  3.5 - 5.1 mEq/L   Chloride 104  96 - 112 mEq/L   CO2 23  19 - 32 mEq/L   Glucose, Bld 89  70 - 99 mg/dL   BUN 12  6 - 23 mg/dL   Creatinine, Ser 0.86  0.50 - 1.10 mg/dL   Calcium 57.8  8.4 - 46.9 mg/dL   Total Protein 7.2  6.0 - 8.3 g/dL   Albumin 4.1  3.5 - 5.2 g/dL   AST 14  0 - 37 U/L   ALT 10  0 - 35 U/L   Alkaline Phosphatase 83  39 - 117 U/L   Total Bilirubin 0.1 (*) 0.3 - 1.2 mg/dL   GFR calc non Af Amer 80 (*) >90 mL/min   GFR calc Af Amer >90  >90 mL/min  LIPASE, BLOOD      Result Value Range   Lipase 30  11 -  59 U/L  TROPONIN I      Result Value Range   Troponin I <0.30  <0.30 ng/mL    Dg Cholangiogram Operative  05/23/2012  *RADIOLOGY REPORT*  Clinical Data:Cholecystectomy  INTRAOPERATIVE CHOLANGIOGRAM  Technique:  Multiple fluoroscopic spot radiographs were obtained during intraoperative cholangiogram and are submitted for interpretation post-operatively.  Fluoroscopy Time: 0 minutes 11 seconds  Comparison: Ultrasound 04/24  Findings: C-arm images show contrast injected to the cystic duct remnant.  There is good filling of the main intrahepatic ducts, the common hepatic duct and the common bile duct.  There is no evidence of stone or obstruction.  Some mixing artifact is present in the distal cystic duct.  IMPRESSION: No evidence of ductal stone or obstruction.   Original Report Authenticated By: Paulina Fusi, M.D.    US Abdomen Complete  05/22/2012  *RADIOLOGY REPORT*  Clinical Data:  Right upper quadrant abdominal pain  COMPLETE ABDOMINAL ULTRASOUND  Comparison:  None  Findings:  Gallbladder:  Multiple gallstones.  Mild wall thickening at 3.2 mm. Positive sonographic Murphy's sign.  Common bile duct:  Prominent distally up to 6 mm.  Proximal portion obscured by artifact.  Liver:  Increased in  echogenicity, in keeping with fatty infiltration.  Focal lesion detection limited in this setting.  IVC:  Appears normal.  Pancreas:  No focal abnormality seen within the head/neck.  The tail and body are obscured by bowel gas artifact.  Spleen:  Measures 7 cm oblique.  No focal abnormality.  Right Kidney:  Measures 11.2 cm.  No hydronephrosis or focal abnormality.  Left Kidney:  Measures 11.2 cm.  No hydronephrosis or focal abnormality.  Abdominal aorta:  No aneurysm identified.  IMPRESSION: Gallstones, mild gallbladder wall thickening, and positive sonographic Murphy's sign; suspicious for acute cholecystitis.  Mild CBD prominence at 6 mm.  Obstructing lesion not visualized. Correlate with LFTs and consider ERCP or intraoperative cholangiogram if the patient undergoes surgery.  Hepatic steatosis.   Original Report Authenticated By: Jearld Lesch, M.D.     Discharge Exam:  Filed Vitals:   05/24/12 0555  BP: 109/60  Pulse: 65  Temp: 98.4 F (36.9 C)  Resp: 18    General: WN WF who is alert and generally healthy appearing.  Lungs: Clear to auscultation and symmetric breath sounds. Heart:  RRR. No murmur or rub. Abdomen: Soft. Incisions look good.  Few BS.  Discharge Medications:     Medication List    TAKE these medications       amoxicillin-clavulanate 875-125 MG per tablet  Commonly known as:  AUGMENTIN  Take 1 tablet by mouth 2 (two) times daily.     lithium carbonate 450 MG CR tablet  Commonly known as:  ESKALITH  Take 450 mg by mouth 2 (two) times daily.     multivitamin with minerals Tabs  Take 1 tablet by mouth daily.     oxyCODONE 5 MG immediate release tablet  Commonly known as:  Oxy IR/ROXICODONE  Take 1-2 tablets (5-10 mg total) by mouth every 6 (six) hours as needed for pain (1-2 tablets every 6 hours).     pramipexole 1.5 MG tablet  Commonly known as:  MIRAPEX  Take 1.5 mg by mouth at bedtime.     venlafaxine XR 150 MG 24 hr capsule  Commonly known as:   EFFEXOR-XR  Take 150 mg by mouth daily.        Disposition: 01-Home or Self Care      Discharge Orders   Future  Orders Complete By Expires     Diet - low sodium heart healthy  As directed     Increase activity slowly  As directed        Return to work on:  06/09/2012  Activity:  Driving - May drive in 2 or 3 days, if doing well   Lifting - No lifting > 15 pounds for 1 week, then PRN.  Wound Care:   May shower starting tomorrow  Diet:  Low fat  Follow up appointment:  Call Dr. Dixon Boos office [facility]) at [phone] for an appointment in 10 - 14 days.  Medications and dosages:  Resume your home medications.  You have a prescription for:  Oxycodone for pain and Augmentin for 5 days of antibiotics.  Signed: Ovidio Kin, M.D., FACS  05/24/2012, 8:59 AM

## 2012-05-26 ENCOUNTER — Encounter (HOSPITAL_COMMUNITY): Payer: Self-pay | Admitting: General Surgery

## 2012-05-27 ENCOUNTER — Telehealth (INDEPENDENT_AMBULATORY_CARE_PROVIDER_SITE_OTHER): Payer: Self-pay | Admitting: General Surgery

## 2012-05-27 NOTE — Telephone Encounter (Signed)
Received staff message from [facility] asking if I could set up a DOW appt on patient because the first thing that JW had was the end of May and the patient had surgery on 05/23/12 so I called the patient and set it up for 5/13 at 12:30 and told the patient that she would be seeing one of the PA's and she was fine with this

## 2012-05-29 ENCOUNTER — Telehealth (INDEPENDENT_AMBULATORY_CARE_PROVIDER_SITE_OTHER): Payer: Self-pay

## 2012-05-29 NOTE — Telephone Encounter (Signed)
Pt calling to report that she has been helping her Mother, who has terminal brain cancer.  She is having a lot of pain and fatigue.  I explained that she was trying to do too much too soon after surgery, and recommended rest and Ibuprofen.  Pt understood and agreed.

## 2012-06-10 ENCOUNTER — Encounter (INDEPENDENT_AMBULATORY_CARE_PROVIDER_SITE_OTHER): Payer: Self-pay | Admitting: Internal Medicine

## 2012-06-10 ENCOUNTER — Ambulatory Visit (INDEPENDENT_AMBULATORY_CARE_PROVIDER_SITE_OTHER): Payer: BC Managed Care – PPO | Admitting: Internal Medicine

## 2012-06-10 VITALS — BP 122/78 | HR 96 | Temp 99.0°F | Resp 18 | Ht 67.5 in | Wt 220.0 lb

## 2012-06-10 DIAGNOSIS — K810 Acute cholecystitis: Secondary | ICD-10-CM

## 2012-06-10 NOTE — Patient Instructions (Signed)
May resume regular activity without restrictions. Follow up as needed. Call with questions or concerns.  

## 2012-06-10 NOTE — Progress Notes (Signed)
  Subjective: Pt returns to the clinic today after undergoing laparoscopic cholecystectomy on 05/23/12 by Dr. Lindie Spruce.  She is having pain in the gallbladder fossa area and RUQ with some sharp shooting pains that go to the shoulder blades.  She does have some nausea at times.  She is also having some diarrhea.  She has many stressors at home right now with her mom being sick (brain and lung cancer) and the patient has had 2 surgeries in the past month.  No problems with the wounds.  Objective: Vital signs in last 24 hours: Reviewed  PE: Abd: soft, non-tender, +bs, incisions well healed  Lab Results:  No results found for this basename: WBC, HGB, HCT, PLT,  in the last 72 hours BMET No results found for this basename: NA, K, CL, CO2, GLUCOSE, BUN, CREATININE, CALCIUM,  in the last 72 hours PT/INR No results found for this basename: LABPROT, INR,  in the last 72 hours CMP     Component Value Date/Time   NA 138 05/22/2012 2043   K 3.6 05/22/2012 2043   CL 104 05/22/2012 2043   CO2 23 05/22/2012 2043   GLUCOSE 89 05/22/2012 2043   BUN 12 05/22/2012 2043   CREATININE 0.91 05/22/2012 2043   CALCIUM 10.4 05/22/2012 2043   PROT 7.2 05/22/2012 2043   ALBUMIN 4.1 05/22/2012 2043   AST 14 05/22/2012 2043   ALT 10 05/22/2012 2043   ALKPHOS 83 05/22/2012 2043   BILITOT 0.1* 05/22/2012 2043   GFRNONAA 80* 05/22/2012 2043   GFRAA >90 05/22/2012 2043   Lipase     Component Value Date/Time   LIPASE 30 05/22/2012 2043       Studies/Results: No results found.  Anti-infectives: Anti-infectives   None       Assessment/Plan  1.  S/P Laparoscopic Cholecystectomy: pt seems to having prolonged post-operative symptoms due to inability to rest and recover form having two major surgeries in one month.  I do not believe there is an acute surgical issue but more likely prolonged symptoms from not being able to fully rest and recover.  She May resume regular activity without restrictions, Pt will follow up with  [place] PRN and knows to call with questions or concerns.     Megin Consalvo 06/10/2012

## 2012-12-04 ENCOUNTER — Other Ambulatory Visit: Payer: Self-pay

## 2013-11-30 ENCOUNTER — Encounter (INDEPENDENT_AMBULATORY_CARE_PROVIDER_SITE_OTHER): Payer: Self-pay | Admitting: Internal Medicine

## 2015-09-21 ENCOUNTER — Encounter (HOSPITAL_COMMUNITY): Payer: Self-pay

## 2015-09-21 ENCOUNTER — Emergency Department (HOSPITAL_COMMUNITY)
Admission: EM | Admit: 2015-09-21 | Discharge: 2015-09-21 | Disposition: A | Discharge status: Home / Self Care | Payer: BLUE CROSS/BLUE SHIELD | Attending: Dermatology | Admitting: Dermatology

## 2015-09-21 DIAGNOSIS — Z5321 Procedure and treatment not carried out due to patient leaving prior to being seen by health care provider: Secondary | ICD-10-CM | POA: Insufficient documentation

## 2015-09-21 DIAGNOSIS — K6289 Other specified diseases of anus and rectum: Secondary | ICD-10-CM | POA: Diagnosis present

## 2015-09-21 NOTE — ED Notes (Signed)
Called to take to treatment room  No response from lobby 

## 2015-09-21 NOTE — ED Notes (Signed)
Called for third time  No response from lobby  

## 2015-09-21 NOTE — ED Triage Notes (Signed)
Called pt twice for reasses vital no response

## 2015-09-21 NOTE — ED Triage Notes (Addendum)
Pt c/o 10/10 rectal pain that started x5 days ago and has worsened today. Pt states that n/v accompanies the rectal pain. Pt denies hemorrhoids. Pt denies hx of rectal cancer. Pt denies hx of rectal surgery. Pt denies blood in stools. Pt denies constipation. Pt A+OX4, speaking in complete sentences.

## 2015-09-21 NOTE — ED Notes (Signed)
PT called for second time without response from lobby

## 2017-09-07 ENCOUNTER — Emergency Department (HOSPITAL_COMMUNITY): Payer: Self-pay

## 2017-09-07 ENCOUNTER — Emergency Department (HOSPITAL_COMMUNITY)
Admission: EM | Admit: 2017-09-07 | Discharge: 2017-09-08 | Disposition: A | Discharge status: Home / Self Care | Payer: Self-pay | Attending: Emergency Medicine | Admitting: Emergency Medicine

## 2017-09-07 ENCOUNTER — Other Ambulatory Visit: Payer: Self-pay

## 2017-09-07 ENCOUNTER — Encounter (HOSPITAL_COMMUNITY): Payer: Self-pay | Admitting: Emergency Medicine

## 2017-09-07 DIAGNOSIS — G2581 Restless legs syndrome: Secondary | ICD-10-CM | POA: Insufficient documentation

## 2017-09-07 DIAGNOSIS — R410 Disorientation, unspecified: Secondary | ICD-10-CM | POA: Insufficient documentation

## 2017-09-07 DIAGNOSIS — R42 Dizziness and giddiness: Secondary | ICD-10-CM | POA: Insufficient documentation

## 2017-09-07 DIAGNOSIS — Z79899 Other long term (current) drug therapy: Secondary | ICD-10-CM | POA: Insufficient documentation

## 2017-09-07 DIAGNOSIS — R55 Syncope and collapse: Secondary | ICD-10-CM | POA: Insufficient documentation

## 2017-09-07 DIAGNOSIS — F17210 Nicotine dependence, cigarettes, uncomplicated: Secondary | ICD-10-CM | POA: Insufficient documentation

## 2017-09-07 DIAGNOSIS — H538 Other visual disturbances: Secondary | ICD-10-CM | POA: Insufficient documentation

## 2017-09-07 LAB — COMPREHENSIVE METABOLIC PANEL
ALK PHOS: 65 U/L (ref 38–126)
ALT: 17 U/L (ref 0–44)
AST: 16 U/L (ref 15–41)
Albumin: 3.7 g/dL (ref 3.5–5.0)
Anion gap: 7 (ref 5–15)
BUN: 7 mg/dL (ref 6–20)
CALCIUM: 9.1 mg/dL (ref 8.9–10.3)
CHLORIDE: 109 mmol/L (ref 98–111)
CO2: 20 mmol/L — AB (ref 22–32)
CREATININE: 0.85 mg/dL (ref 0.44–1.00)
GFR calc Af Amer: >60 mL/min (ref >60)
Glucose, Bld: 92 mg/dL (ref 70–99)
Potassium: 3.2 mmol/L — ABNORMAL LOW (ref 3.5–5.1)
Sodium: 136 mmol/L (ref 135–145)
Total Bilirubin: 0.4 mg/dL (ref 0.3–1.2)
Total Protein: 6.1 g/dL — ABNORMAL LOW (ref 6.5–8.1)

## 2017-09-07 LAB — CBC WITH DIFFERENTIAL/PLATELET
ABS IMMATURE GRANULOCYTES: 0.1 10*3/uL (ref 0.0–0.1)
BASOS PCT: 1 %
Basophils Absolute: 0.1 10*3/uL (ref 0.0–0.1)
Eosinophils Absolute: 0.5 10*3/uL (ref 0.0–0.7)
Eosinophils Relative: 3 %
HCT: 38 % (ref 36.0–46.0)
Hemoglobin: 12.1 g/dL (ref 12.0–15.0)
IMMATURE GRANULOCYTES: 0 %
LYMPHS PCT: 29 %
Lymphs Abs: 3.9 10*3/uL (ref 0.7–4.0)
MCH: 29.9 pg (ref 26.0–34.0)
MCHC: 31.8 g/dL (ref 30.0–36.0)
MCV: 93.8 fL (ref 78.0–100.0)
MONO ABS: 0.9 10*3/uL (ref 0.1–1.0)
MONOS PCT: 6 %
NEUTROS ABS: 8.2 10*3/uL — AB (ref 1.7–7.7)
NEUTROS PCT: 61 %
PLATELETS: 185 10*3/uL (ref 150–400)
RBC: 4.05 MIL/uL (ref 3.87–5.11)
RDW: 12.9 % (ref 11.5–15.5)
WBC: 13.5 10*3/uL — ABNORMAL HIGH (ref 4.0–10.5)

## 2017-09-07 LAB — URINALYSIS, ROUTINE W REFLEX MICROSCOPIC
BILIRUBIN URINE: NEGATIVE
Glucose, UA: NEGATIVE mg/dL
Hgb urine dipstick: NEGATIVE
KETONES UR: NEGATIVE mg/dL
LEUKOCYTES UA: NEGATIVE
NITRITE: NEGATIVE
PH: 6 (ref 5.0–8.0)
Protein, ur: NEGATIVE mg/dL
Specific Gravity, Urine: 1.004 — ABNORMAL LOW (ref 1.005–1.030)

## 2017-09-07 LAB — RAPID URINE DRUG SCREEN, HOSP PERFORMED
Amphetamines: NOT DETECTED
BARBITURATES: NOT DETECTED
BENZODIAZEPINES: POSITIVE — AB
COCAINE: NOT DETECTED
Opiates: NOT DETECTED
Tetrahydrocannabinol: NOT DETECTED

## 2017-09-07 LAB — LIPASE, BLOOD: LIPASE: 35 U/L (ref 11–51)

## 2017-09-07 LAB — AMMONIA: Ammonia: 37 umol/L — ABNORMAL HIGH (ref 9–35)

## 2017-09-07 LAB — LITHIUM LEVEL: Lithium Lvl: 0.6 mmol/L (ref 0.60–1.20)

## 2017-09-07 MED ORDER — DIPHENHYDRAMINE HCL 50 MG/ML IJ SOLN
12.5000 mg | Freq: Once | INTRAMUSCULAR | Status: AC
Start: 2017-09-07 — End: 2017-09-07
  Administered 2017-09-07: 12.5 mg via INTRAVENOUS
  Filled 2017-09-07: qty 1

## 2017-09-07 MED ORDER — DIPHENHYDRAMINE HCL 50 MG/ML IJ SOLN
12.5000 mg | Freq: Once | INTRAMUSCULAR | Status: AC
  Administered 2017-09-07: 12.5 mg via INTRAVENOUS
  Filled 2017-09-07: qty 1

## 2017-09-07 MED ORDER — LORAZEPAM 2 MG/ML IJ SOLN
1.0000 mg | Freq: Once | INTRAMUSCULAR | Status: AC
Start: 2017-09-07 — End: 2017-09-07
  Administered 2017-09-07: 1 mg via INTRAVENOUS
  Filled 2017-09-07: qty 1

## 2017-09-07 MED ORDER — PROCHLORPERAZINE EDISYLATE 10 MG/2ML IJ SOLN
10.0000 mg | Freq: Once | INTRAMUSCULAR | Status: AC
  Administered 2017-09-07: 10 mg via INTRAVENOUS
  Filled 2017-09-07: qty 2

## 2017-09-07 NOTE — ED Triage Notes (Signed)
EMS was called to [facility] due to the patient feeling dizzy and confused.  Denies LOC, weakness, syncope.  EMS advised patient is appropriate, GSC15 and stroke screen is negative.  [facility] and [facility].  On arrival  Patient is GCS15, denies pain and is alert and oriented X4.

## 2017-09-07 NOTE — ED Provider Notes (Signed)
[facility] EMERGENCY DEPARTMENT Provider Note   CSN: [id number] Arrival date & time: 09/07/17  1947     History   Chief Complaint Chief Complaint  Patient presents with  . Dizziness    HPI Lynn Hubbard is a 42 y.o. female with a history of optic neuritis, eustachian tube dysfunction, anxiety, and  bipolar 1 disorder who presents to the emergency department by EMS with a chief complaint of confusion.  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  The patient's daughter reports that she received a call from her mom asking her to come and check on her.  The patient's daughter states that at that time that her mother told her that a coworker said that she was not making sense.  The patient reports that she felt like she remembered being at work, but cannot really recall the conversations with the interactions that she had with her customers over the last few hours.  The patient reports associated dizziness with halos and black spots in her bilateral visual fields.  She denies diplopia, blurred vision, or loss of vision.  She also reports an associated bilateral frontal headache and left-sided headache at the base of the skull.  She reports a history of chronic headaches, but reports she has been having more frequent headaches over the last few days.  She also reports that she has had loss of vision, last episode was several weeks ago in the left eye. She cannot recall any history of visual loss in the right eye.  The patient's daughter reports that after she arrived that the patient had a syncopal episode that lasted for less than 30 seconds in the parking lot.  No shaking or jerking.  The patient did not hit her head.  Patient reports a history of a similar when she was having orthostatic hypotension.  She denies chest pain, dyspnea, fever, chills, neck pain or stiffness, URI  symptoms, nausea, vomiting, diarrhea, back pain, lightheadedness, or tinnitus.  Home medications include lithium, Mirapex, and Effexor XR.  No recent medication changes.  She is a current, 0.5 PPD smoker.  She denies alcohol, IV, or recreational drug use.  She reports that she was admitted to [facility] several years ago for 5 days stay after she was diagnosed with optic neuritis by her ophthalmologist.  She reports that she had multiple MRIs and a lumbar puncture, but was never diagnosed with MS.  The history is provided by the patient. No language interpreter was used.  Dizziness  Associated symptoms: headaches   Associated symptoms: no chest pain, no diarrhea, no nausea, no palpitations, no shortness of breath, no tinnitus, no vomiting and no weakness     Past Medical History:  Diagnosis Date  . Anxiety   . Bipolar 1 disorder (HCC)   . Bipolar 1 disorder (HCC)   . Depression   . Headache(784.0)    migraines  . Mitral valve prolapse   . Optic neuritis   . Restless leg syndrome   . Syncope     Patient Active Problem List   Diagnosis Date Noted  . Acute cholecystitis 05/23/2012    Past Surgical History:  Procedure Laterality Date  . ABDOMINAL HYSTERECTOMY    . CHOLECYSTECTOMY N/A 05/23/2012   Procedure: LAPAROSCOPIC CHOLECYSTECTOMY WITH INTRAOPERATIVE CHOLANGIOGRAM;  Surgeon: Cherylynn Ridges, MD;  Location: [facility] OR;  Service: General;  Laterality:  N/A;  . TONSILLECTOMY    . TUBAL LIGATION    . TYMPANOSTOMY TUBE PLACEMENT    . VAGINAL HYSTERECTOMY N/A 03/19/2012   Procedure: HYSTERECTOMY VAGINAL;  Surgeon: Mickel Baas, MD;  Location: [facility] ORS;  Service: Gynecology;  Laterality: N/A;     OB History    Gravida  3   Para      Term      Preterm      AB      Living  3     SAB      TAB      Ectopic      Multiple      Live Births               Home Medications    Prior to Admission medications   Medication Sig Start Date End Date Taking?  Authorizing Provider  lithium carbonate (ESKALITH) 450 MG CR tablet Take 450 mg by mouth 2 (two) times daily.    [provider]  Multiple Vitamin (MULTIVITAMIN WITH MINERALS) TABS Take 1 tablet by mouth daily.    [provider]  pramipexole (MIRAPEX) 1.5 MG tablet Take 1.5 mg by mouth at bedtime.    [provider]  venlafaxine XR (EFFEXOR-XR) 150 MG 24 hr capsule Take 150 mg by mouth daily.    [provider]    Family History Family History  Problem Relation Age of Onset  . Hypertension Mother   . Cancer Mother        Lung,Brain  . Diabetes Mother     Social History Social History   Tobacco Use  . Smoking status: Current Every Day Smoker    Packs/day: 0.50    Types: Cigarettes  . Smokeless tobacco: Never Used  Substance Use Topics  . Alcohol use: No  . Drug use: No     Allergies   Klonopin [clonazepam]   Review of Systems Review of Systems  Constitutional: Negative for activity change, chills and fever.  HENT: Negative for congestion, sore throat and tinnitus.   Eyes: Positive for visual disturbance. Negative for pain.  Respiratory: Negative for cough, shortness of breath and wheezing.   Cardiovascular: Negative for chest pain, palpitations and leg swelling.  Gastrointestinal: Negative for abdominal pain, diarrhea, nausea and vomiting.  Genitourinary: Negative for dysuria.  Musculoskeletal: Negative for back pain and neck pain.  Skin: Negative for rash.  Allergic/Immunologic: Negative for immunocompromised state.  Neurological: Positive for dizziness, syncope and headaches. Negative for seizures, weakness, light-headedness and numbness.  Psychiatric/Behavioral: Negative for confusion.   Physical Exam Updated Vital Signs BP 118/60 (BP Location: Left Arm)   Pulse 62   Temp 98.2 F (36.8 C) (Oral)   Resp 18   Ht 5\' 6"  (1.676 m)   Wt 95.3 kg   LMP 02/23/2012   SpO2 98%   BMI 33.89 kg/m   Physical Exam  Constitutional:  She is oriented to person, place, and time. No distress.  HENT:  Head: Normocephalic.  Eyes: Pupils are equal, round, and reactive to light. Conjunctivae and EOM are normal. No scleral icterus.  Neck: Normal range of motion. Neck supple. No JVD present. No tracheal deviation present. No thyromegaly present.  Cardiovascular: Normal rate, regular rhythm, normal heart sounds and intact distal pulses. Exam reveals no gallop and no friction rub.  No murmur heard. Pulmonary/Chest: Effort normal. No stridor. No respiratory distress. She has no wheezes. She has no rales. She exhibits no tenderness.  Abdominal: Soft. She  exhibits no distension and no mass. There is no tenderness. There is no rebound and no guarding. No hernia.  Musculoskeletal: She exhibits no tenderness.  Lymphadenopathy:    She has no cervical adenopathy.  Neurological: She is alert and oriented to person, place, and time.  Alert and oriented x4.  Cranial nerves II through XII are grossly intact.  Finger-nose and heel-to-shin are intact bilaterally.  5 out of 5 strength against resistance of the bilateral upper and lower extremities.  Sensation is equal and intact throughout.   Skin: Skin is warm. Capillary refill takes less than 2 seconds. No rash noted. She is not diaphoretic. No erythema. No pallor.  Psychiatric: Her behavior is normal.  Nursing note and vitals reviewed.  ED Treatments / Results  Labs (all labs ordered are listed, but only abnormal results are displayed) Labs Reviewed  URINALYSIS, ROUTINE W REFLEX MICROSCOPIC - Abnormal; Notable for the following components:      Result Value   Color, Urine STRAW (*)    Specific Gravity, Urine 1.004 (*)    All other components within normal limits  RAPID URINE DRUG SCREEN, HOSP PERFORMED - Abnormal; Notable for the following components:   Benzodiazepines POSITIVE (*)    All other components within normal limits  LITHIUM LEVEL  CBC WITH DIFFERENTIAL/PLATELET  COMPREHENSIVE  METABOLIC PANEL  LIPASE, BLOOD  AMMONIA    EKG None  Radiology Ct Head Wo Contrast  Result Date: 09/07/2017 CLINICAL DATA:  Acute onset of dizziness and confusion. EXAM: CT HEAD WITHOUT CONTRAST TECHNIQUE: Contiguous axial images were obtained from the base of the skull through the vertex without intravenous contrast. COMPARISON:  CT of the head performed 07/13/2016, and MRI of the brain performed 09/05/2015 FINDINGS: Brain: No evidence of acute infarction, hemorrhage, hydrocephalus, extra-axial collection or mass lesion/mass effect. The posterior fossa, including the cerebellum, brainstem and fourth ventricle, is within normal limits. The third and lateral ventricles, and basal ganglia are unremarkable in appearance. The cerebral hemispheres are symmetric in appearance, with normal gray-white differentiation. No mass effect or midline shift is seen. Vascular: No hyperdense vessel or unexpected calcification. Skull: There is no evidence of fracture; visualized osseous structures are unremarkable in appearance. Sinuses/Orbits: The orbits are within normal limits. There is mild partial opacification of the right side of the sphenoid sinus. There is partial opacification of the mastoid air cells. The remaining paranasal sinuses are well-aerated. Other: No significant soft tissue abnormalities are seen. IMPRESSION: 1. No acute intracranial pathology seen on CT. 2. Mild partial opacification of the right side of the sphenoid sinus. Partial opacification of the mastoid air cells. Electronically Signed   By: Roanna Raider M.D.   On: 09/07/2017 21:20    Procedures Procedures (including critical care time)  Medications Ordered in ED Medications  prochlorperazine (COMPAZINE) injection 10 mg (10 mg Intravenous Given 09/07/17 2229)  diphenhydrAMINE (BENADRYL) injection 12.5 mg (12.5 mg Intravenous Given 09/07/17 2230)     Initial Impression / Assessment and Plan / ED Course  I have reviewed the triage  vital signs and the nursing notes.  Pertinent labs & imaging results that were available during my care of the patient were reviewed by me and considered in my medical decision making (see chart for details).     42 year old female with a history of optic neuritis, eustachian tube dysfunction, anxiety, and  bipolar 1 disorder presenting by EMS with visual changes, headache, confusion, and syncope.  The patient was seen and evaluated along with Dr. Adriana Simas, attending  physician.  No focal neurologic deficits seen on exam.  Patient is alert and oriented x4, but is having difficulty recalling details of the afternoon.  Spoke with Dr. Laurence SlateAroor with neurology who recommended MR brain with and without contrast with sagittal FLAIR protocol for MS given patient's history of recurrent optic neuritis.  CT head with mild partial opacification of the right sphenoid sinus and partial epic education of the mastoid air cells. Compazine and Benadryl given for headache.  Visual acuity is unremarkable.  Orthostatic vital signs and labs are pending at this time. [facility] at the end of my shift. Patient presentation, ED course, and plan of care discussed with review of all pertinent labs and imaging. Please see his/her note for further details regarding further ED course and disposition.  Final Clinical Impressions(s) / ED Diagnoses   Final diagnoses:  None    ED Discharge Orders    None       Barkley BoardsMcDonald, Aasir Daigler A, PA-C 09/07/17 2236    Donnetta Hutchingook, Brian, MD 09/08/17 [phone]

## 2017-09-07 NOTE — ED Notes (Signed)
Patient transported to MRI 

## 2017-09-08 NOTE — ED Provider Notes (Signed)
Patient with history of optic neuritis, anxiety, and bipolar 1 disorder presents to the emergency department for confusion and dizziness.  Patient signed out to me at shift change pending MRI to rule out multiple sclerosis.   Patient was unable to tolerate the MRI due to restless legs.  On my reassessment she states that she feels tired and groggy and wants to go home.  She was given Ativan and Benadryl prior to the MRI.  I think it is reasonable that she feels groggy.  She is alert and oriented, and has no neurologic deficit on my exam.  She ambulates appropriately and has normal sensation and strength through all of her extremities.  Her speech is clear.    I discussed the fact that the patient was not able to complete the MRI with Dr. Laurence SlateAroor, he states that in the limited images he was able to review he did not see any overt flair signal, but still recommends additional outpatient follow-up.  Appreciate his input over the telephone, no formal consultation at this time.  Patient will follow up with outpatient neurology.  Laboratory work-up is otherwise reassuring.  At this time, I do not feel that the patient needs any additional emergent work-up.  Vital signs are stable.  Patient is afebrile.  We will discharged home.   Roxy HorsemanBrowning, Mainor Hellmann, PA-C 09/08/17 [phone]    Donnetta Hutchingook, Brian, MD 09/08/17 [phone]

## 2017-09-08 NOTE — ED Notes (Signed)
Patient is alert and orientedx4.  Patient was explained discharge instructions and they understood them with no questions.  The patient's significant other, Luster LandsbergRhonda Lamm is taking her home.

## 2020-02-26 IMAGING — MR MR HEAD W/O CM
5 series · 48 of 48 positions shown · non-contrast
Comparison: 09/07/2017 CT head.  09/05/2015 MRI head.

CLINICAL DATA: 42 y/o F; history of vision loss and restless leg
syndrome. Patient presents with episode of confusion, headache, and
and vision changes. Optic neuritis.

EXAM:
MRI HEAD WITHOUT CONTRAST
TECHNIQUE: Axial DWI, sagittal T1, axial T2 blade, axial T2 FLAIR sequences
were acquired. The patient declined to continue additional
sequences.

[Series 5: ax dwi_tracew · axial · 3.0mm · 1.44mm/px · z∈[-48,+101]mm · 18 of 88 slices shown]
[im 1/88]
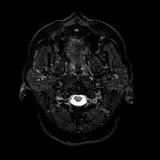
[im 6/88]
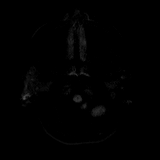
[im 11/88]
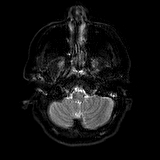
[im 16/88]
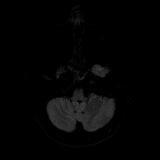
[im 21/88]
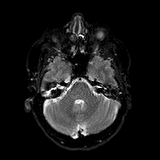
[im 26/88]
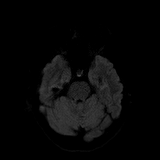
[im 31/88]
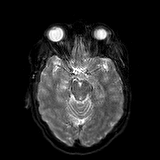
[im 36/88]
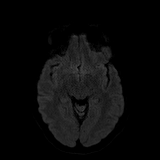
[im 41/88]
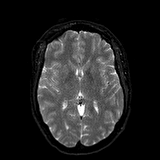
[im 47/88]
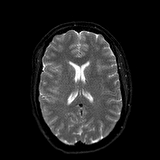
[im 52/88]
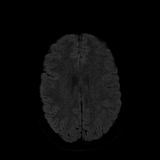
[im 57/88]
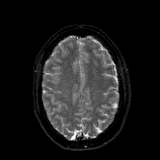
[im 62/88]
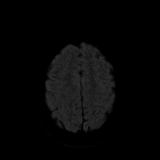
[im 67/88]
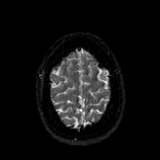
[im 72/88]
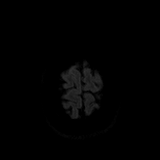
[im 77/88]
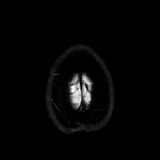
[im 82/88]
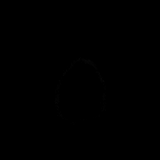
[im 88/88]
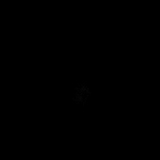

[Series 6: ax dwi_adc · axial · 3.0mm · 1.44mm/px · z∈[-48,+101]mm · 9 of 44 slices shown]
[im 1/44]
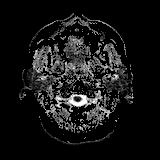
[im 6/44]
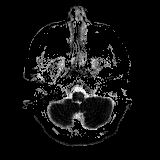
[im 11/44]
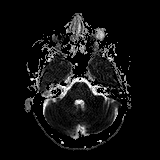
[im 17/44]
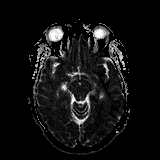
[im 22/44]
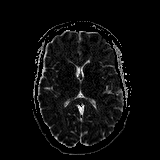
[im 27/44]
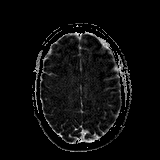
[im 33/44]
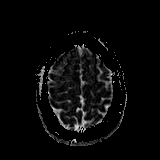
[im 38/44]
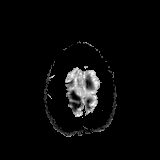
[im 44/44]
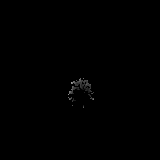

[Series 7: T1 · sagittal · 5.0mm · 0.72mm/px · 6 of 31 slices shown]
[im 1/31]
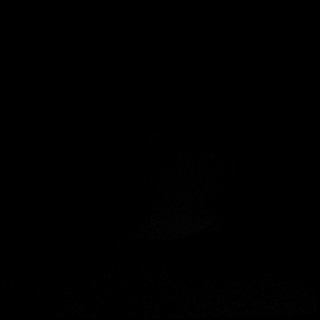
[im 7/31]
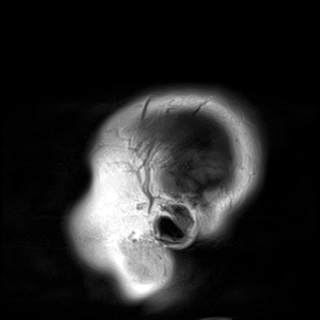
[im 13/31]
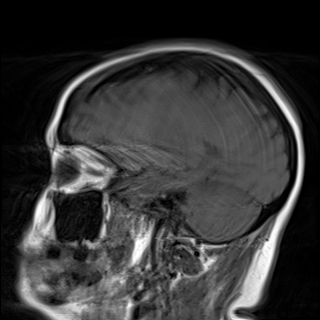
[im 19/31]
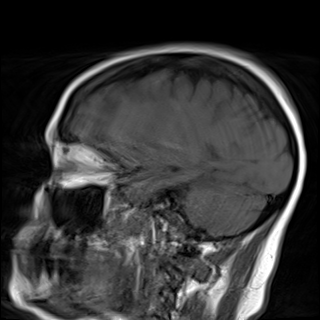
[im 25/31]
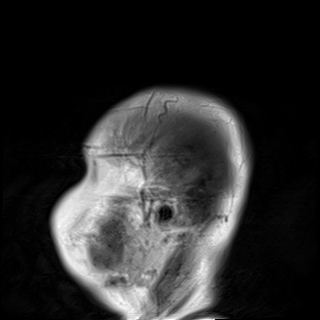
[im 31/31]
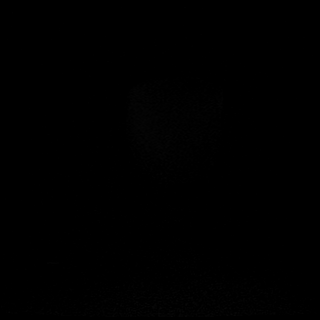

[Series 8: T2 · axial · 4.0mm · 0.72mm/px · z∈[-51,+103]mm · 6 of 32 slices shown]
[im 1/32]
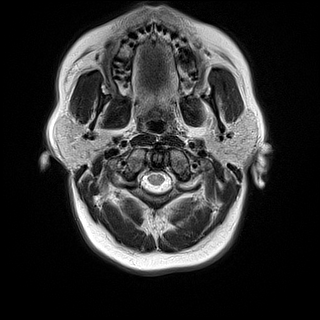
[im 7/32]
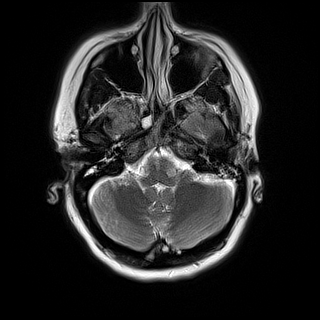
[im 13/32]
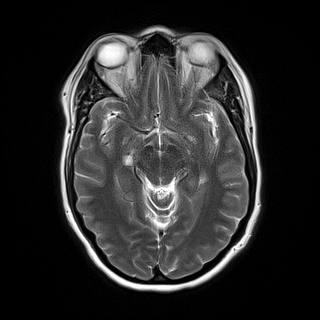
[im 19/32]
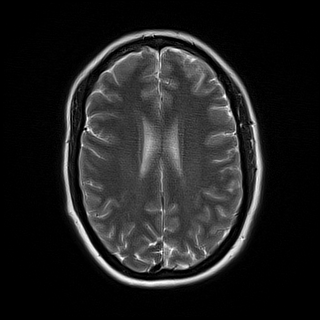
[im 25/32]
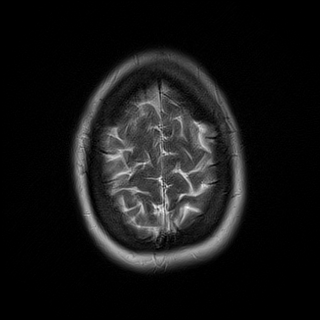
[im 32/32]
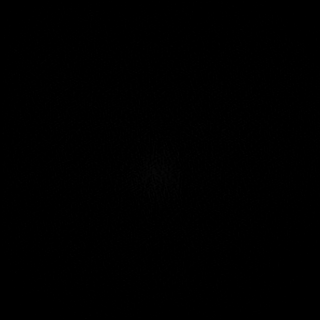

[Series 9: FLAIR · axial · 3.0mm · 0.45mm/px · z∈[-52,+96]mm · 9 of 44 slices shown]
[im 1/44]
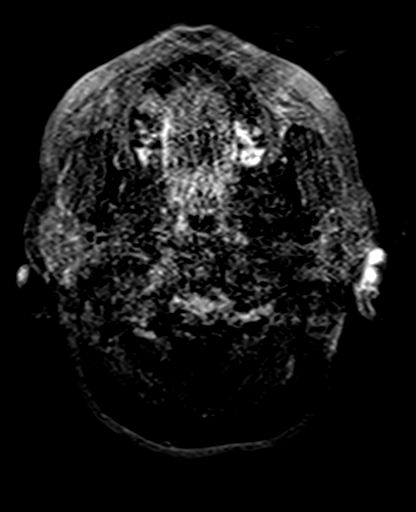
[im 6/44]
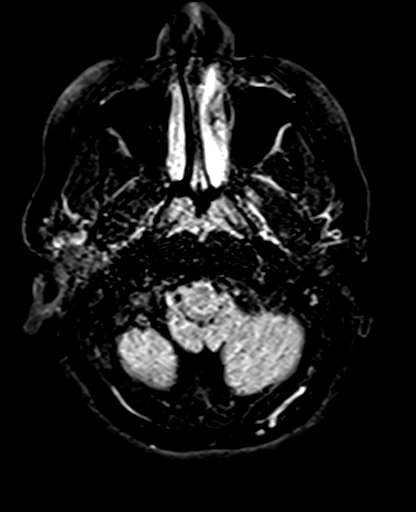
[im 11/44]
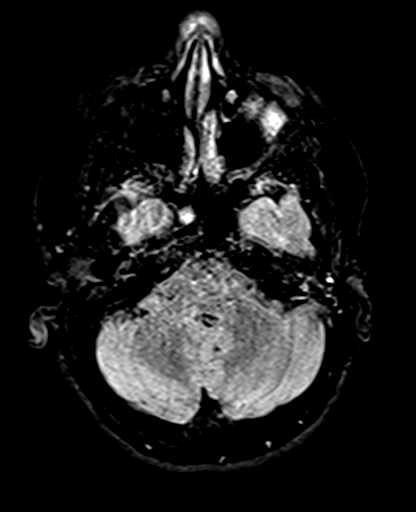
[im 17/44]
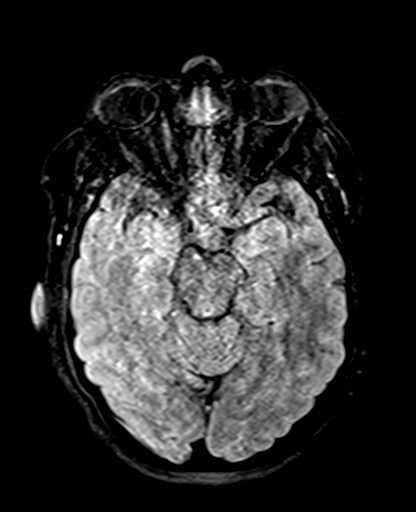
[im 22/44]
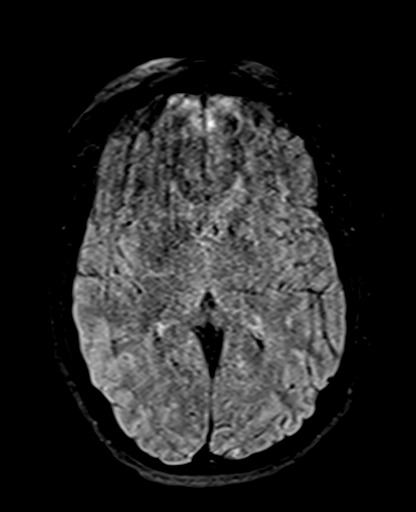
[im 27/44]
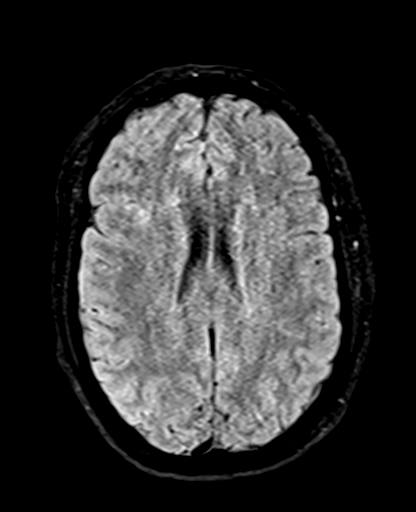
[im 33/44]
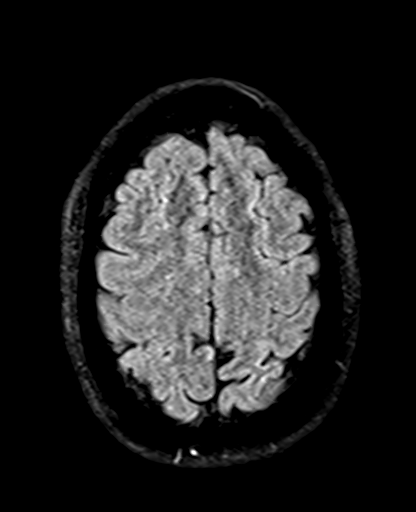
[im 38/44]
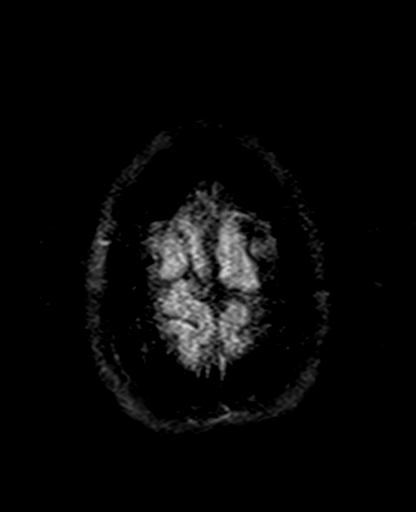
[im 44/44]
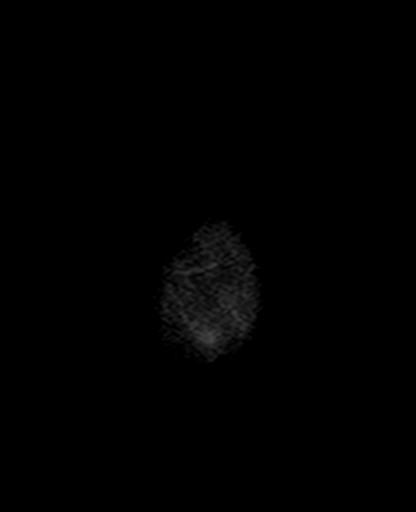

[48 of 48 positions shown; findings below may reference images not displayed]

FINDINGS: Brain: Mild motion artifact. No reduced diffusion to suggest acute
or early subacute infarction. Motion artifact predominantly
involving the sagittal T1 and axial T2 FLAIR sequences. No
extra-axial collection, hydrocephalus, effacement of basilar
cisterns, or herniation identified. Stable subcentimeter right-sided
choroidal fissure cyst.

Vascular: Normal flow voids.

Skull and upper cervical spine: Normal marrow signal.

Sinuses/Orbits: Mucous retention cysts within the right sphenoid
sinus. Bilateral mastoid air cell opacification. Orbits are grossly
unremarkable.

Other: None.
IMPRESSION: 1. Limited sequences and motion artifact. Repeat MRI of the brain is
recommended when patient is able to remain still.
2. No findings of stroke, hemorrhage, or mass effect.
3. Bilateral mastoid air cell opacification.

By: Gaston Antonio Binimelis M.D.
